# Patient Record
Sex: Male | Born: 1965 | Race: White | Hispanic: No | Marital: Married | Smoking: Never smoker
Health system: Southern US, Community
[De-identification: ages and names within clinical notes are randomized; demographics above are authoritative.]

## PROBLEM LIST (undated history)

## (undated) DIAGNOSIS — R519 Headache, unspecified: Secondary | ICD-10-CM

## (undated) DIAGNOSIS — R51 Headache: Secondary | ICD-10-CM

## (undated) DIAGNOSIS — I10 Essential (primary) hypertension: Secondary | ICD-10-CM

## (undated) DIAGNOSIS — K219 Gastro-esophageal reflux disease without esophagitis: Secondary | ICD-10-CM

## (undated) HISTORY — PX: EYE SURGERY: SHX253

## (undated) HISTORY — PX: KNEE CARTILAGE SURGERY: SHX688

---

## 1999-11-02 ENCOUNTER — Ambulatory Visit (HOSPITAL_COMMUNITY): Admission: RE | Admit: 1999-11-02 | Discharge: 1999-11-02 | Payer: Self-pay | Admitting: Gastroenterology

## 2000-02-17 ENCOUNTER — Ambulatory Visit (HOSPITAL_COMMUNITY): Admission: RE | Admit: 2000-02-17 | Discharge: 2000-02-17 | Payer: Self-pay | Admitting: *Deleted

## 2000-02-20 ENCOUNTER — Ambulatory Visit (HOSPITAL_COMMUNITY): Admission: RE | Admit: 2000-02-20 | Discharge: 2000-02-20 | Payer: Self-pay | Admitting: *Deleted

## 2000-02-20 ENCOUNTER — Encounter: Payer: Self-pay | Admitting: Internal Medicine

## 2002-06-17 ENCOUNTER — Ambulatory Visit (HOSPITAL_COMMUNITY): Admission: RE | Admit: 2002-06-17 | Discharge: 2002-06-17 | Payer: Self-pay | Admitting: Gastroenterology

## 2002-11-24 ENCOUNTER — Ambulatory Visit (HOSPITAL_COMMUNITY): Admission: RE | Admit: 2002-11-24 | Discharge: 2002-11-24 | Payer: Self-pay | Admitting: Internal Medicine

## 2002-11-26 ENCOUNTER — Encounter: Payer: Self-pay | Admitting: *Deleted

## 2002-11-26 ENCOUNTER — Ambulatory Visit (HOSPITAL_COMMUNITY): Admission: RE | Admit: 2002-11-26 | Discharge: 2002-11-26 | Payer: Self-pay | Admitting: *Deleted

## 2005-03-01 ENCOUNTER — Ambulatory Visit (HOSPITAL_COMMUNITY): Admission: RE | Admit: 2005-03-01 | Discharge: 2005-03-01 | Payer: Self-pay | Admitting: Gastroenterology

## 2005-11-29 ENCOUNTER — Emergency Department (HOSPITAL_COMMUNITY): Admission: EM | Admit: 2005-11-29 | Discharge: 2005-11-29 | Payer: Self-pay | Admitting: Emergency Medicine

## 2015-04-22 ENCOUNTER — Encounter (INDEPENDENT_AMBULATORY_CARE_PROVIDER_SITE_OTHER): Payer: Self-pay | Admitting: *Deleted

## 2015-05-26 MED FILL — BYSTOLIC 5 MG TABLET: 5 | 90 days supply | Qty: 90 | Fill #1

## 2015-05-26 MED FILL — NORTRIPTYLINE HCL 10 MG CAP: 10 | 90 days supply | Qty: 180 | Fill #3

## 2015-05-26 MED FILL — VALACYCLOVIR HCL 500 MG TAB: 500 | 30 days supply | Qty: 30 | Fill #5

## 2015-06-28 MED FILL — VALACYCLOVIR HCL 500 MG TAB: 500 | 30 days supply | Qty: 30 | Fill #6

## 2015-07-26 MED FILL — VALACYCLOVIR HCL 500 MG TAB: 500 | 30 days supply | Qty: 30 | Fill #7

## 2015-07-26 MED FILL — PANTOPRAZOLE SOD DR 40 MG T: 40 | 90 days supply | Qty: 90 | Fill #1

## 2015-08-17 DIAGNOSIS — H35033 Hypertensive retinopathy, bilateral: Secondary | ICD-10-CM | POA: Diagnosis not present

## 2015-08-17 DIAGNOSIS — B0051 Herpesviral iridocyclitis: Secondary | ICD-10-CM | POA: Diagnosis not present

## 2015-08-17 DIAGNOSIS — H21542 Posterior synechiae (iris), left eye: Secondary | ICD-10-CM | POA: Diagnosis not present

## 2015-08-17 MED FILL — VALACYCLOVIR HCL 500 MG TAB: 500 | 30 days supply | Qty: 30 | Fill #0

## 2015-08-17 MED FILL — NORTRIPTYLINE HCL 10 MG CAP: 10 | 90 days supply | Qty: 180 | Fill #0

## 2015-08-23 MED FILL — BYSTOLIC 5 MG TABLET: 5 | 90 days supply | Qty: 90 | Fill #2

## 2015-08-25 DIAGNOSIS — J029 Acute pharyngitis, unspecified: Secondary | ICD-10-CM | POA: Diagnosis not present

## 2015-08-25 DIAGNOSIS — L0292 Furuncle, unspecified: Secondary | ICD-10-CM | POA: Diagnosis not present

## 2015-09-27 MED FILL — VALACYCLOVIR HCL 500 MG TAB: 500 | 30 days supply | Qty: 30 | Fill #1

## 2015-10-27 MED FILL — PANTOPRAZOLE SOD DR 40 MG T: 40 | 90 days supply | Qty: 90 | Fill #2

## 2015-10-27 MED FILL — VALACYCLOVIR HCL 500 MG TAB: 500 | 30 days supply | Qty: 30 | Fill #2

## 2015-11-22 MED FILL — NORTRIPTYLINE HCL 10 MG CAP: 10 | 90 days supply | Qty: 180 | Fill #1

## 2015-11-30 MED FILL — BYSTOLIC 5 MG TABLET: 5 | 90 days supply | Qty: 90 | Fill #3

## 2015-11-30 MED FILL — VALACYCLOVIR HCL 500 MG TAB: 500 | 30 days supply | Qty: 30 | Fill #3

## 2015-12-24 MED FILL — VALACYCLOVIR HCL 500 MG TAB: 500 | 30 days supply | Qty: 30 | Fill #4

## 2016-01-20 MED FILL — VALACYCLOVIR HCL 500 MG TAB: 500 | 30 days supply | Qty: 30 | Fill #5

## 2016-01-20 MED FILL — PANTOPRAZOLE SOD DR 40 MG T: 40 | 90 days supply | Qty: 90 | Fill #3

## 2016-02-21 MED FILL — VALACYCLOVIR HCL 500 MG TAB: 500 | 30 days supply | Qty: 30 | Fill #6

## 2016-02-21 MED FILL — BYSTOLIC 5 MG TABLET: 5 | 90 days supply | Qty: 90 | Fill #4

## 2016-02-24 MED FILL — NORTRIPTYLINE HCL 10 MG CAP: 10 | 90 days supply | Qty: 180 | Fill #2

## 2016-03-22 MED FILL — VALACYCLOVIR HCL 500 MG TAB: 500 | 30 days supply | Qty: 30 | Fill #7

## 2016-04-19 MED FILL — PANTOPRAZOLE SOD DR 40 MG T: 40 | 90 days supply | Qty: 90 | Fill #4

## 2016-04-19 MED FILL — VALACYCLOVIR HCL 500 MG TAB: 500 | 30 days supply | Qty: 30 | Fill #8

## 2016-05-19 MED FILL — NORTRIPTYLINE HCL 10 MG CAP: 10 | 90 days supply | Qty: 180 | Fill #0

## 2016-05-19 MED FILL — VALACYCLOVIR HCL 500 MG TAB: 500 | 30 days supply | Qty: 30 | Fill #9

## 2016-05-26 MED FILL — BYSTOLIC 5 MG TABLET: 5 | 90 days supply | Qty: 90 | Fill #0

## 2016-06-19 MED FILL — VALACYCLOVIR HCL 500 MG TAB: 500 | 30 days supply | Qty: 30 | Fill #10

## 2016-07-17 MED FILL — VALACYCLOVIR HCL 500 MG TAB: 500 | 30 days supply | Qty: 30 | Fill #11

## 2016-07-19 MED FILL — PANTOPRAZOLE SOD DR 40 MG T: 40 | 90 days supply | Qty: 90 | Fill #0

## 2016-08-10 MED FILL — VALACYCLOVIR HCL 500 MG TAB: 500 | 30 days supply | Qty: 30 | Fill #0

## 2016-08-21 MED FILL — NORTRIPTYLINE HCL 10 MG CAP: 10 | 90 days supply | Qty: 180 | Fill #1

## 2016-08-21 MED FILL — BYSTOLIC 5 MG TABLET: 5 | 90 days supply | Qty: 90 | Fill #1

## 2016-09-08 DIAGNOSIS — Z0000 Encounter for general adult medical examination without abnormal findings: Secondary | ICD-10-CM | POA: Diagnosis not present

## 2016-09-08 DIAGNOSIS — Z1211 Encounter for screening for malignant neoplasm of colon: Secondary | ICD-10-CM | POA: Diagnosis not present

## 2016-09-08 DIAGNOSIS — Z789 Other specified health status: Secondary | ICD-10-CM | POA: Diagnosis not present

## 2016-09-08 DIAGNOSIS — I10 Essential (primary) hypertension: Secondary | ICD-10-CM | POA: Diagnosis not present

## 2016-09-08 DIAGNOSIS — Z1389 Encounter for screening for other disorder: Secondary | ICD-10-CM | POA: Diagnosis not present

## 2016-09-08 DIAGNOSIS — K219 Gastro-esophageal reflux disease without esophagitis: Secondary | ICD-10-CM | POA: Diagnosis not present

## 2016-09-08 DIAGNOSIS — Z125 Encounter for screening for malignant neoplasm of prostate: Secondary | ICD-10-CM | POA: Diagnosis not present

## 2016-09-08 DIAGNOSIS — Z6833 Body mass index (BMI) 33.0-33.9, adult: Secondary | ICD-10-CM | POA: Diagnosis not present

## 2016-09-08 DIAGNOSIS — Z79899 Other long term (current) drug therapy: Secondary | ICD-10-CM | POA: Diagnosis not present

## 2016-09-08 DIAGNOSIS — Z299 Encounter for prophylactic measures, unspecified: Secondary | ICD-10-CM | POA: Diagnosis not present

## 2016-09-08 DIAGNOSIS — R51 Headache: Secondary | ICD-10-CM | POA: Diagnosis not present

## 2016-09-08 DIAGNOSIS — R5383 Other fatigue: Secondary | ICD-10-CM | POA: Diagnosis not present

## 2016-09-08 MED FILL — SUMATRIPTAN SUCC 50 MG TAB: 50 | 30 days supply | Qty: 18 | Fill #0

## 2016-09-13 ENCOUNTER — Encounter (INDEPENDENT_AMBULATORY_CARE_PROVIDER_SITE_OTHER): Payer: Self-pay | Admitting: *Deleted

## 2016-09-18 MED FILL — VALACYCLOVIR HCL 500 MG TAB: 500 | 30 days supply | Qty: 30 | Fill #1

## 2016-09-19 DIAGNOSIS — B079 Viral wart, unspecified: Secondary | ICD-10-CM | POA: Diagnosis not present

## 2016-09-19 DIAGNOSIS — D485 Neoplasm of uncertain behavior of skin: Secondary | ICD-10-CM | POA: Diagnosis not present

## 2016-09-19 DIAGNOSIS — L821 Other seborrheic keratosis: Secondary | ICD-10-CM | POA: Diagnosis not present

## 2016-09-19 DIAGNOSIS — D225 Melanocytic nevi of trunk: Secondary | ICD-10-CM | POA: Diagnosis not present

## 2016-10-16 MED FILL — PANTOPRAZOLE SOD DR 40 MG T: 40 | 90 days supply | Qty: 90 | Fill #0

## 2016-10-16 MED FILL — VALACYCLOVIR HCL 500 MG TAB: 500 | 30 days supply | Qty: 30 | Fill #2

## 2016-11-13 MED FILL — VALACYCLOVIR HCL 500 MG TAB: 500 | 30 days supply | Qty: 30 | Fill #3

## 2016-11-20 MED FILL — NORTRIPTYLINE HCL 10 MG CAP: 10 | 90 days supply | Qty: 180 | Fill #2

## 2016-11-20 MED FILL — BYSTOLIC 5 MG TABLET: 5 | 90 days supply | Qty: 90 | Fill #2

## 2016-12-11 MED FILL — VALACYCLOVIR HCL 500 MG TAB: 500 | 30 days supply | Qty: 30 | Fill #4

## 2016-12-19 DIAGNOSIS — H35033 Hypertensive retinopathy, bilateral: Secondary | ICD-10-CM | POA: Diagnosis not present

## 2016-12-19 DIAGNOSIS — H21542 Posterior synechiae (iris), left eye: Secondary | ICD-10-CM | POA: Diagnosis not present

## 2016-12-19 DIAGNOSIS — B0051 Herpesviral iridocyclitis: Secondary | ICD-10-CM | POA: Diagnosis not present

## 2016-12-19 DIAGNOSIS — H3554 Dystrophies primarily involving the retinal pigment epithelium: Secondary | ICD-10-CM | POA: Diagnosis not present

## 2016-12-20 MED FILL — PREDNISOLONE AC 1% EYE DROP: 1 | 30 days supply | Qty: 5 | Fill #0

## 2017-01-05 MED FILL — PANTOPRAZOLE SOD DR 40 MG T: 40 | 90 days supply | Qty: 90 | Fill #1

## 2017-01-05 MED FILL — VALACYCLOVIR HCL 500 MG TAB: 500 | 30 days supply | Qty: 30 | Fill #5

## 2017-02-12 MED FILL — NORTRIPTYLINE HCL 10 MG CAP: 10 | 90 days supply | Qty: 180 | Fill #3

## 2017-02-12 MED FILL — VALACYCLOVIR HCL 500 MG TAB: 500 | 30 days supply | Qty: 30 | Fill #6

## 2017-02-12 MED FILL — BYSTOLIC 5 MG TABLET: 5 | 90 days supply | Qty: 90 | Fill #3

## 2017-03-13 MED FILL — VALACYCLOVIR HCL 500 MG TAB: 500 | 30 days supply | Qty: 30 | Fill #7

## 2017-04-10 MED FILL — PANTOPRAZOLE SOD DR 40 MG T: 40 | 90 days supply | Qty: 90 | Fill #2

## 2017-04-10 MED FILL — VALACYCLOVIR HCL 500 MG TAB: 500 | 30 days supply | Qty: 30 | Fill #8

## 2017-05-16 MED FILL — VALACYCLOVIR HCL 500 MG TAB: 500 | 30 days supply | Qty: 30 | Fill #9

## 2017-05-16 MED FILL — NORTRIPTYLINE HCL 10 MG CAP: 10 | 90 days supply | Qty: 180 | Fill #4

## 2017-05-28 MED FILL — BYSTOLIC 5 MG TABLET: 5 | 90 days supply | Qty: 90 | Fill #0

## 2017-06-12 MED FILL — VALACYCLOVIR HCL 500 MG TAB: 500 | 30 days supply | Qty: 30 | Fill #10

## 2017-07-11 MED FILL — VALACYCLOVIR HCL 500 MG TAB: 500 | 30 days supply | Qty: 30 | Fill #11

## 2017-07-13 MED FILL — PANTOPRAZOLE SOD DR 40 MG T: 40 | 90 days supply | Qty: 90 | Fill #3

## 2017-07-18 ENCOUNTER — Ambulatory Visit (INDEPENDENT_AMBULATORY_CARE_PROVIDER_SITE_OTHER): Payer: Self-pay | Admitting: Urgent Care

## 2017-07-18 ENCOUNTER — Encounter: Payer: Self-pay | Admitting: Urgent Care

## 2017-07-18 VITALS — BP 175/98 | HR 63 | Temp 98.1°F | Resp 18 | Ht 69.0 in | Wt 225.8 lb

## 2017-07-18 DIAGNOSIS — Z024 Encounter for examination for driving license: Secondary | ICD-10-CM

## 2017-07-18 DIAGNOSIS — B0050 Herpesviral ocular disease, unspecified: Secondary | ICD-10-CM

## 2017-07-18 DIAGNOSIS — I10 Essential (primary) hypertension: Secondary | ICD-10-CM

## 2017-07-18 NOTE — Patient Instructions (Addendum)
Please see your eye doctor for formal testing and bring back documentation of the results. You also need to check back with your PCP for your blood pressure as it is not qualifying at this time. We will not charge a visit today but I will be happy to see you back once these items are squared away.    Health Maintenance, Male A healthy lifestyle and preventive care is important for your health and wellness. Ask your health care provider about what schedule of regular examinations is right for you. What should I know about weight and diet? Eat a Healthy Diet  Eat plenty of vegetables, fruits, whole grains, low-fat dairy products, and lean protein.  Do not eat a lot of foods high in solid fats, added sugars, or salt.  Maintain a Healthy Weight Regular exercise can help you achieve or maintain a healthy weight. You should:  Do at least 150 minutes of exercise each week. The exercise should increase your heart rate and make you sweat (moderate-intensity exercise).  Do strength-training exercises at least twice a week.  Watch Your Levels of Cholesterol and Blood Lipids  Have your blood tested for lipids and cholesterol every 5 years starting at 53 years of age. If you are at high risk for heart disease, you should start having your blood tested when you are 53 years old. You may need to have your cholesterol levels checked more often if: ? Your lipid or cholesterol levels are high. ? You are older than 53 years of age. ? You are at high risk for heart disease.  What should I know about cancer screening? Many types of cancers can be detected early and may often be prevented. Lung Cancer  You should be screened every year for lung cancer if: ? You are a current smoker who has smoked for at least 30 years. ? You are a former smoker who has quit within the past 15 years.  Talk to your health care provider about your screening options, when you should start screening, and how often you should  be screened.  Colorectal Cancer  Routine colorectal cancer screening usually begins at 53 years of age and should be repeated every 5-10 years until you are 53 years old. You may need to be screened more often if early forms of precancerous polyps or small growths are found. Your health care provider may recommend screening at an earlier age if you have risk factors for colon cancer.  Your health care provider may recommend using home test kits to check for hidden blood in the stool.  A small camera at the end of a tube can be used to examine your colon (sigmoidoscopy or colonoscopy). This checks for the earliest forms of colorectal cancer.  Prostate and Testicular Cancer  Depending on your age and overall health, your health care provider may do certain tests to screen for prostate and testicular cancer.  Talk to your health care provider about any symptoms or concerns you have about testicular or prostate cancer.  Skin Cancer  Check your skin from head to toe regularly.  Tell your health care provider about any new moles or changes in moles, especially if: ? There is a change in a mole's size, shape, or color. ? You have a mole that is larger than a pencil eraser.  Always use sunscreen. Apply sunscreen liberally and repeat throughout the day.  Protect yourself by wearing long sleeves, pants, a wide-brimmed hat, and sunglasses when outside.  What should  I know about heart disease, diabetes, and high blood pressure?  If you are 69-69 years of age, have your blood pressure checked every 3-5 years. If you are 59 years of age or older, have your blood pressure checked every year. You should have your blood pressure measured twice-once when you are at a hospital or clinic, and once when you are not at a hospital or clinic. Record the average of the two measurements. To check your blood pressure when you are not at a hospital or clinic, you can use: ? An automated blood pressure machine at  a pharmacy. ? A home blood pressure monitor.  Talk to your health care provider about your target blood pressure.  If you are between 75-76 years old, ask your health care provider if you should take aspirin to prevent heart disease.  Have regular diabetes screenings by checking your fasting blood sugar level. ? If you are at a normal weight and have a low risk for diabetes, have this test once every three years after the age of 49. ? If you are overweight and have a high risk for diabetes, consider being tested at a younger age or more often.  A one-time screening for abdominal aortic aneurysm (AAA) by ultrasound is recommended for men aged 74-75 years who are current or former smokers. What should I know about preventing infection? Hepatitis B If you have a higher risk for hepatitis B, you should be screened for this virus. Talk with your health care provider to find out if you are at risk for hepatitis B infection. Hepatitis C Blood testing is recommended for:  Everyone born from [birth year] through 1966.  Anyone with known risk factors for hepatitis C.  Sexually Transmitted Diseases (STDs)  You should be screened each year for STDs including gonorrhea and chlamydia if: ? You are sexually active and are younger than 53 years of age. ? You are older than 53 years of age and your health care provider tells you that you are at risk for this type of infection. ? Your sexual activity has changed since you were last screened and you are at an increased risk for chlamydia or gonorrhea. Ask your health care provider if you are at risk.  Talk with your health care provider about whether you are at high risk of being infected with HIV. Your health care provider may recommend a prescription medicine to help prevent HIV infection.  What else can I do?  Schedule regular health, dental, and eye exams.  Stay current with your vaccines (immunizations).  Do not use any tobacco products, such as  cigarettes, chewing tobacco, and e-cigarettes. If you need help quitting, ask your health care provider.  Limit alcohol intake to no more than 2 drinks per day. One drink equals 12 ounces of beer, 5 ounces of wine, or 1 ounces of hard liquor.  Do not use street drugs.  Do not share needles.  Ask your health care provider for help if you need support or information about quitting drugs.  Tell your health care provider if you often feel depressed.  Tell your health care provider if you have ever been abused or do not feel safe at home. This information is not intended to replace advice given to you by your health care provider. Make sure you discuss any questions you have with your health care provider. Document Released: 10/28/2007 Document Revised: 12/29/2015 Document Reviewed: 02/02/2015 Elsevier Interactive Patient Education  [employer].  IF you received an x-ray today, you will receive an invoice from [facility]. Please contact [facility] at [phone] with questions or concerns regarding your invoice.   IF you received labwork today, you will receive an invoice from [facility]. Please contact [facility] at [phone] with questions or concerns regarding your invoice.   Our billing staff will not be able to assist you with questions regarding bills from these companies.  You will be contacted with the lab results as soon as they are available. The fastest way to get your results is to activate your My Chart account. Instructions are located on the last page of this paperwork. If you have not heard from us regarding the results in 2 weeks, please contact this office.      

## 2017-07-18 NOTE — Progress Notes (Signed)
  Commercial Driver Medical Examination   Xavier Walsh is a 52 y.o. male who presents today for a DOT physical exam. The patient reports that he has previously been disqualified for DOT due to his vision. He does not have an active DOT certificate. His HTN is managed by Dr. Woody Seller. Has not seen him for recheck.  The following portions of the patient's history were reviewed and updated as appropriate: allergies, current medications, past family history, past medical history, past social history and past surgical history.  Objective:   BP (!) 175/98   Pulse 63   Temp 98.1 F (36.7 C) (Oral)   Resp 18   Ht 5\' 9"  (1.753 m)   Wt 225 lb 12.8 oz (102.4 kg)   SpO2 98%   BMI 33.34 kg/m   Vision/hearing:  Visual Acuity Screening   Right eye Left eye Both eyes  Without correction:     With correction: 20/20 20/70 20/20   Comments: Peripheral Vision: Right eye 70 degrees. Left eye 70 degrees.  The patient can distinguish the colors red, amber and green.  Hearing Screening Comments: The patient was able to hear a forced whisper from 10 feet.  Patient can recognize and distinguish among traffic control signals and devices showing standard red, green, and amber colors.  Corrective lenses required: Yes  Monocular Vision?: No  Hearing aid requirement: No  Physical Exam Not examined.  Labs: Comments: Urine Specimen:   SpGr:  1.015   Blood: Negative   Glucose:  Negative   Protein:  Negative  Assessment:   Patient not examined due to uncontrolled HTN, failing his eye screen.    Plan:   Patient will see his ophthalmologist and bring back documentation. He will also see Dr. Woody Seller to recheck and start management of his HTN. No charge today as I did not review his DOT form fully or examine the patient due to disqualifying parameters. Follow up as needed.  Jaynee Eagles, PA-C [facility] [phone] 07/18/2017  9:29 AM

## 2017-08-07 MED FILL — VALACYCLOVIR HCL 500 MG TAB: 500 | 30 days supply | Qty: 30 | Fill #0

## 2017-08-07 MED FILL — NORTRIPTYLINE HCL 10 MG CAP: 10 | 90 days supply | Qty: 180 | Fill #0

## 2017-08-29 MED FILL — BYSTOLIC 5 MG TABLET: 5 | 90 days supply | Qty: 90 | Fill #1

## 2017-09-11 MED FILL — VALACYCLOVIR HCL 500 MG TAB: 500 | 30 days supply | Qty: 30 | Fill #1

## 2017-09-14 DIAGNOSIS — K219 Gastro-esophageal reflux disease without esophagitis: Secondary | ICD-10-CM | POA: Diagnosis not present

## 2017-09-14 DIAGNOSIS — Z299 Encounter for prophylactic measures, unspecified: Secondary | ICD-10-CM | POA: Diagnosis not present

## 2017-09-14 DIAGNOSIS — Z1331 Encounter for screening for depression: Secondary | ICD-10-CM | POA: Diagnosis not present

## 2017-09-14 DIAGNOSIS — Z79899 Other long term (current) drug therapy: Secondary | ICD-10-CM | POA: Diagnosis not present

## 2017-09-14 DIAGNOSIS — Z0000 Encounter for general adult medical examination without abnormal findings: Secondary | ICD-10-CM | POA: Diagnosis not present

## 2017-09-14 DIAGNOSIS — R51 Headache: Secondary | ICD-10-CM | POA: Diagnosis not present

## 2017-09-14 DIAGNOSIS — Z1211 Encounter for screening for malignant neoplasm of colon: Secondary | ICD-10-CM | POA: Diagnosis not present

## 2017-09-14 DIAGNOSIS — Z789 Other specified health status: Secondary | ICD-10-CM | POA: Diagnosis not present

## 2017-09-14 DIAGNOSIS — Z125 Encounter for screening for malignant neoplasm of prostate: Secondary | ICD-10-CM | POA: Diagnosis not present

## 2017-09-14 DIAGNOSIS — I10 Essential (primary) hypertension: Secondary | ICD-10-CM | POA: Diagnosis not present

## 2017-09-14 DIAGNOSIS — Z6832 Body mass index (BMI) 32.0-32.9, adult: Secondary | ICD-10-CM | POA: Diagnosis not present

## 2017-09-14 DIAGNOSIS — R5383 Other fatigue: Secondary | ICD-10-CM | POA: Diagnosis not present

## 2017-09-14 MED FILL — SUMATRIPTAN SUCC 50 MG TAB: 50 | 30 days supply | Qty: 15 | Fill #0

## 2017-10-09 MED FILL — VALACYCLOVIR HCL 500 MG TAB: 500 | 30 days supply | Qty: 30 | Fill #2

## 2017-10-09 MED FILL — PANTOPRAZOLE SOD DR 40 MG T: 40 | 90 days supply | Qty: 90 | Fill #0

## 2017-10-12 ENCOUNTER — Encounter (INDEPENDENT_AMBULATORY_CARE_PROVIDER_SITE_OTHER): Payer: Self-pay | Admitting: *Deleted

## 2017-11-01 ENCOUNTER — Encounter (INDEPENDENT_AMBULATORY_CARE_PROVIDER_SITE_OTHER): Payer: Self-pay | Admitting: *Deleted

## 2017-11-02 ENCOUNTER — Other Ambulatory Visit (INDEPENDENT_AMBULATORY_CARE_PROVIDER_SITE_OTHER): Payer: Self-pay | Admitting: *Deleted

## 2017-11-02 DIAGNOSIS — Z1211 Encounter for screening for malignant neoplasm of colon: Secondary | ICD-10-CM

## 2017-11-12 MED FILL — NORTRIPTYLINE HCL 10 MG CAP: 10 | 90 days supply | Qty: 180 | Fill #1

## 2017-11-12 MED FILL — VALACYCLOVIR HCL 500 MG TAB: 500 | 30 days supply | Qty: 30 | Fill #3

## 2017-11-19 MED FILL — BYSTOLIC 5 MG TABLET: 5 | 90 days supply | Qty: 90 | Fill #2

## 2017-12-11 MED FILL — VALACYCLOVIR HCL 500 MG TAB: 500 | 30 days supply | Qty: 30 | Fill #4

## 2017-12-19 ENCOUNTER — Telehealth (INDEPENDENT_AMBULATORY_CARE_PROVIDER_SITE_OTHER): Payer: Self-pay | Admitting: *Deleted

## 2017-12-19 ENCOUNTER — Encounter (INDEPENDENT_AMBULATORY_CARE_PROVIDER_SITE_OTHER): Payer: Self-pay | Admitting: *Deleted

## 2017-12-19 MED ORDER — SUPREP BOWEL PREP KIT 17.5-3.13-1.6 GM/177ML PO SOLN: 1.0000 | Freq: Once | ORAL | 0 refills | Status: AC

## 2017-12-19 MED FILL — SUPREP BOWEL PREP KIT: 17.5-3.13-1 | 1 days supply | Qty: 354 | Fill #0

## 2017-12-19 NOTE — Telephone Encounter (Signed)
Patient needs suprep 

## 2017-12-28 ENCOUNTER — Encounter (INDEPENDENT_AMBULATORY_CARE_PROVIDER_SITE_OTHER): Payer: Self-pay | Admitting: *Deleted

## 2017-12-31 ENCOUNTER — Telehealth (INDEPENDENT_AMBULATORY_CARE_PROVIDER_SITE_OTHER): Payer: Self-pay | Admitting: *Deleted

## 2017-12-31 NOTE — Telephone Encounter (Signed)
Referring MD/PCP: vyas   Procedure: tcs  Reason/Indication:  screening  Has patient had this procedure before?  no  If so, when, by whom and where?    Is there a family history of colon cancer?  no  Who?  What age when diagnosed?    Is patient diabetic?   no      Does patient have prosthetic heart valve or mechanical valve?  no  Do you have a pacemaker?  no  Has patient ever had endocarditis? no  Has patient had joint replacement within last 12 months?  no  Is patient constipated or do they take laxatives? no  Does patient have a history of alcohol/drug use?  no  Is patient on blood thinner such as Coumadin, Plavix and/or Aspirin? no  Medications: pantoprazole 40 mg daily, bystolic 5 mg daily, valcyclovir 500 mg daily, nortriptyline 20 mg daily, ibuprofen prn, sumatriptan 50 mg prn  Allergies: reglan, fluorescein  Medication Adjustment per Dr Lindi Adie, NP:   Procedure date & time: 01/28/18 at 730

## 2018-01-01 NOTE — Telephone Encounter (Signed)
agree

## 2018-01-09 ENCOUNTER — Other Ambulatory Visit (HOSPITAL_COMMUNITY): Payer: Self-pay | Admitting: Internal Medicine

## 2018-01-09 ENCOUNTER — Ambulatory Visit (HOSPITAL_COMMUNITY)
Admission: RE | Admit: 2018-01-09 | Discharge: 2018-01-09 | Disposition: A | Discharge status: Home / Self Care | Payer: 59 | Source: Ambulatory Visit | Attending: Internal Medicine | Admitting: Internal Medicine

## 2018-01-09 ENCOUNTER — Other Ambulatory Visit (INDEPENDENT_AMBULATORY_CARE_PROVIDER_SITE_OTHER): Payer: Self-pay | Admitting: *Deleted

## 2018-01-09 DIAGNOSIS — I10 Essential (primary) hypertension: Secondary | ICD-10-CM | POA: Diagnosis not present

## 2018-01-09 DIAGNOSIS — Z299 Encounter for prophylactic measures, unspecified: Secondary | ICD-10-CM | POA: Diagnosis not present

## 2018-01-09 DIAGNOSIS — Z1211 Encounter for screening for malignant neoplasm of colon: Secondary | ICD-10-CM

## 2018-01-09 DIAGNOSIS — R059 Cough, unspecified: Secondary | ICD-10-CM

## 2018-01-09 DIAGNOSIS — R509 Fever, unspecified: Secondary | ICD-10-CM | POA: Diagnosis not present

## 2018-01-09 DIAGNOSIS — Z713 Dietary counseling and surveillance: Secondary | ICD-10-CM | POA: Diagnosis not present

## 2018-01-09 DIAGNOSIS — R05 Cough: Secondary | ICD-10-CM | POA: Diagnosis not present

## 2018-01-09 DIAGNOSIS — K219 Gastro-esophageal reflux disease without esophagitis: Secondary | ICD-10-CM

## 2018-01-09 DIAGNOSIS — Z6832 Body mass index (BMI) 32.0-32.9, adult: Secondary | ICD-10-CM | POA: Diagnosis not present

## 2018-01-09 DIAGNOSIS — R12 Heartburn: Secondary | ICD-10-CM | POA: Insufficient documentation

## 2018-01-09 MED FILL — AMOXICILLIN 500 MG CAPSULE: 500 | 7 days supply | Qty: 21 | Fill #0

## 2018-01-11 MED FILL — PANTOPRAZOLE SOD DR 40 MG T: 40 | 90 days supply | Qty: 90 | Fill #1

## 2018-01-11 MED FILL — VALACYCLOVIR HCL 500 MG TAB: 500 | 30 days supply | Qty: 30 | Fill #0

## 2018-01-28 ENCOUNTER — Other Ambulatory Visit: Payer: Self-pay

## 2018-01-28 ENCOUNTER — Encounter (HOSPITAL_COMMUNITY): Payer: Self-pay

## 2018-01-28 ENCOUNTER — Ambulatory Visit (HOSPITAL_COMMUNITY): Admit: 2018-01-28 | Payer: 59 | Admitting: Internal Medicine

## 2018-01-28 ENCOUNTER — Encounter (HOSPITAL_COMMUNITY)
Admission: RE | Disposition: A | Discharge status: Home / Self Care | Payer: Self-pay | Source: Ambulatory Visit | Attending: Internal Medicine

## 2018-01-28 ENCOUNTER — Ambulatory Visit (HOSPITAL_COMMUNITY)
Admission: RE | Admit: 2018-01-28 | Discharge: 2018-01-28 | Disposition: A | Discharge status: Home / Self Care | Payer: 59 | Source: Ambulatory Visit | Attending: Internal Medicine | Admitting: Internal Medicine

## 2018-01-28 DIAGNOSIS — K222 Esophageal obstruction: Secondary | ICD-10-CM | POA: Diagnosis not present

## 2018-01-28 DIAGNOSIS — R12 Heartburn: Secondary | ICD-10-CM | POA: Insufficient documentation

## 2018-01-28 DIAGNOSIS — Z1211 Encounter for screening for malignant neoplasm of colon: Secondary | ICD-10-CM

## 2018-01-28 DIAGNOSIS — K5730 Diverticulosis of large intestine without perforation or abscess without bleeding: Secondary | ICD-10-CM | POA: Diagnosis not present

## 2018-01-28 DIAGNOSIS — I10 Essential (primary) hypertension: Secondary | ICD-10-CM | POA: Insufficient documentation

## 2018-01-28 DIAGNOSIS — K648 Other hemorrhoids: Secondary | ICD-10-CM | POA: Diagnosis not present

## 2018-01-28 DIAGNOSIS — D127 Benign neoplasm of rectosigmoid junction: Secondary | ICD-10-CM | POA: Diagnosis not present

## 2018-01-28 DIAGNOSIS — R1314 Dysphagia, pharyngoesophageal phase: Secondary | ICD-10-CM | POA: Insufficient documentation

## 2018-01-28 DIAGNOSIS — K621 Rectal polyp: Secondary | ICD-10-CM | POA: Diagnosis not present

## 2018-01-28 DIAGNOSIS — K219 Gastro-esophageal reflux disease without esophagitis: Secondary | ICD-10-CM | POA: Insufficient documentation

## 2018-01-28 DIAGNOSIS — D125 Benign neoplasm of sigmoid colon: Secondary | ICD-10-CM | POA: Insufficient documentation

## 2018-01-28 DIAGNOSIS — Z79899 Other long term (current) drug therapy: Secondary | ICD-10-CM | POA: Diagnosis not present

## 2018-01-28 DIAGNOSIS — K317 Polyp of stomach and duodenum: Secondary | ICD-10-CM | POA: Insufficient documentation

## 2018-01-28 DIAGNOSIS — R1310 Dysphagia, unspecified: Secondary | ICD-10-CM | POA: Diagnosis not present

## 2018-01-28 HISTORY — DX: Headache, unspecified: R51.9

## 2018-01-28 HISTORY — DX: Headache: R51

## 2018-01-28 HISTORY — PX: BIOPSY: SHX5522

## 2018-01-28 HISTORY — PX: POLYPECTOMY: SHX5525

## 2018-01-28 HISTORY — PX: COLONOSCOPY: SHX5424

## 2018-01-28 HISTORY — DX: Essential (primary) hypertension: I10

## 2018-01-28 HISTORY — DX: Gastro-esophageal reflux disease without esophagitis: K21.9

## 2018-01-28 HISTORY — PX: ESOPHAGOGASTRODUODENOSCOPY: SHX5428

## 2018-01-28 SURGERY — COLONOSCOPY
Anesthesia: Moderate Sedation

## 2018-01-28 MED ORDER — LIDOCAINE VISCOUS HCL 2 % MT SOLN
OROMUCOSAL | Status: DC | PRN
  Administered 2018-01-28 (×2): 4 mL via OROMUCOSAL

## 2018-01-28 MED ORDER — SODIUM CHLORIDE 0.9% FLUSH
INTRAVENOUS | Status: AC
  Filled 2018-01-28: qty 10

## 2018-01-28 MED ORDER — SODIUM CHLORIDE 0.9 % IV SOLN
INTRAVENOUS | Status: DC
  Administered 2018-01-28: 07:00:00 via INTRAVENOUS

## 2018-01-28 MED ORDER — STERILE WATER FOR IRRIGATION IR SOLN
Status: DC | PRN
  Administered 2018-01-28: 08:00:00

## 2018-01-28 MED ORDER — LIDOCAINE VISCOUS HCL 2 % MT SOLN
OROMUCOSAL | Status: AC
  Filled 2018-01-28: qty 15

## 2018-01-28 MED ORDER — MEPERIDINE HCL 50 MG/ML IJ SOLN
INTRAMUSCULAR | Status: DC | PRN
  Administered 2018-01-28 (×3): 25 mg via INTRAVENOUS

## 2018-01-28 MED ORDER — PROMETHAZINE HCL 25 MG/ML IJ SOLN
INTRAMUSCULAR | Status: AC
  Filled 2018-01-28: qty 1

## 2018-01-28 MED ORDER — MIDAZOLAM HCL 5 MG/5ML IJ SOLN
INTRAMUSCULAR | Status: DC | PRN
  Administered 2018-01-28 (×5): 2 mg via INTRAVENOUS

## 2018-01-28 MED ORDER — MEPERIDINE HCL 50 MG/ML IJ SOLN
INTRAMUSCULAR | Status: AC
  Filled 2018-01-28: qty 1

## 2018-01-28 MED ORDER — MIDAZOLAM HCL 5 MG/5ML IJ SOLN
INTRAMUSCULAR | Status: AC
  Filled 2018-01-28: qty 10

## 2018-01-28 MED ORDER — PROMETHAZINE HCL 25 MG/ML IJ SOLN
INTRAMUSCULAR | Status: DC | PRN
  Administered 2018-01-28: 6.25 mg via INTRAVENOUS

## 2018-01-28 NOTE — Discharge Instructions (Signed)
Resume usual medications as before.  Remember to take pantoprazole 30 minutes before breakfast daily. Resume usual diet. No driving for 24 hours. Physician will call with biopsy results.   Colonoscopy, Adult, Care After This sheet gives you information about how to care for yourself after your procedure. Your health care provider may also give you more specific instructions. If you have problems or questions, contact your health care provider. What can I expect after the procedure? After the procedure, it is common to have:  A small amount of blood in your stool for 24 hours after the procedure.  Some gas.  Mild abdominal cramping or bloating.  Follow these instructions at home: General instructions   For the first 24 hours after the procedure: ? Do not drive or use machinery. ? Do not sign important documents. ? Do not drink alcohol. ? Do your regular daily activities at a slower pace than normal. ? Eat soft, easy-to-digest foods. ? Rest often.  Take over-the-counter or prescription medicines only as told by your health care provider.  It is up to you to get the results of your procedure. Ask your health care provider, or the department performing the procedure, when your results will be ready. Relieving cramping and bloating  Try walking around when you have cramps or feel bloated.  Apply heat to your abdomen as told by your health care provider. Use a heat source that your health care provider recommends, such as a moist heat pack or a heating pad. ? Place a towel between your skin and the heat source. ? Leave the heat on for 20-30 minutes. ? Remove the heat if your skin turns bright red. This is especially important if you are unable to feel pain, heat, or cold. You may have a greater risk of getting burned. Eating and drinking  Drink enough fluid to keep your urine clear or pale yellow.  Resume your normal diet as instructed by your health care provider. Avoid heavy  or fried foods that are hard to digest.  Avoid drinking alcohol for as long as instructed by your health care provider. Contact a health care provider if:  You have blood in your stool 2-3 days after the procedure. Get help right away if:  You have more than a small spotting of blood in your stool.  You pass large blood clots in your stool.  Your abdomen is swollen.  You have nausea or vomiting.  You have a fever.  You have increasing abdominal pain that is not relieved with medicine. This information is not intended to replace advice given to you by your health care provider. Make sure you discuss any questions you have with your health care provider. Document Released: 12/14/2003 Document Revised: 01/24/2016 Document Reviewed: 07/13/2015 Elsevier Interactive Patient Education  2018 [employer].  Esophagogastroduodenoscopy, Care After Refer to this sheet in the next few weeks. These instructions provide you with information about caring for yourself after your procedure. Your health care provider may also give you more specific instructions. Your treatment has been planned according to current medical practices, but problems sometimes occur. Call your health care provider if you have any problems or questions after your procedure. What can I expect after the procedure? After the procedure, it is common to have:  A sore throat.  Nausea.  Bloating.  Dizziness.  Fatigue.  Follow these instructions at home:  Do not eat or drink anything until the numbing medicine (local anesthetic) has worn off and your gag reflex  has returned. You will know that the local anesthetic has worn off when you can swallow comfortably.  Do not drive for 24 hours if you received a medicine to help you relax (sedative).  If your health care provider took a tissue sample for testing during the procedure, make sure to get your test results. This is your responsibility. Ask your health care provider  or the department performing the test when your results will be ready.  Keep all follow-up visits as told by your health care provider. This is important. Contact a health care provider if:  You cannot stop coughing.  You are not urinating.  You are urinating less than usual. Get help right away if:  You have trouble swallowing.  You cannot eat or drink.  You have throat or chest pain that gets worse.  You are dizzy or light-headed.  You faint.  You have nausea or vomiting.  You have chills.  You have a fever.  You have severe abdominal pain.  You have black, tarry, or bloody stools. This information is not intended to replace advice given to you by your health care provider. Make sure you discuss any questions you have with your health care provider. Document Released: 04/17/2012 Document Revised: 10/07/2015 Document Reviewed: 03/25/2015 Elsevier Interactive Patient Education  2018 [place].   Moderate Conscious Sedation, Adult, Care After These instructions provide you with information about caring for yourself after your procedure. Your health care provider may also give you more specific instructions. Your treatment has been planned according to current medical practices, but problems sometimes occur. Call your health care provider if you have any problems or questions after your procedure. What can I expect after the procedure? After your procedure, it is common:  To feel sleepy for several hours.  To feel clumsy and have poor balance for several hours.  To have poor judgment for several hours.  To vomit if you eat too soon.  Follow these instructions at home: For at least 24 hours after the procedure:   Do not: ? Participate in activities where you could fall or become injured. ? Drive. ? Use heavy machinery. ? Drink alcohol. ? Take sleeping pills or medicines that cause drowsiness. ? Make important decisions or sign legal documents. ? Take care  of children on your own.  Rest. Eating and drinking  Follow the diet recommended by your health care provider.  If you vomit: ? Drink water, juice, or soup when you can drink without vomiting. ? Make sure you have little or no nausea before eating solid foods. General instructions  Have a responsible adult stay with you until you are awake and alert.  Take over-the-counter and prescription medicines only as told by your health care provider.  If you smoke, do not smoke without supervision.  Keep all follow-up visits as told by your health care provider. This is important. Contact a health care provider if:  You keep feeling nauseous or you keep vomiting.  You feel light-headed.  You develop a rash.  You have a fever. Get help right away if:  You have trouble breathing. This information is not intended to replace advice given to you by your health care provider. Make sure you discuss any questions you have with your health care provider. Document Released: 02/19/2013 Document Revised: 10/04/2015 Document Reviewed: 08/21/2015 Elsevier Interactive Patient Education  2018 [place].   Gastroesophageal Reflux Disease, Adult Normally, food travels down the esophagus and stays in the stomach to be  digested. However, when a person has gastroesophageal reflux disease (GERD), food and stomach acid move back up into the esophagus. When this happens, the esophagus becomes sore and inflamed. Over time, GERD can create small holes (ulcers) in the lining of the esophagus. What are the causes? This condition is caused by a problem with the muscle between the esophagus and the stomach (lower esophageal sphincter, or LES). Normally, the LES muscle closes after food passes through the esophagus to the stomach. When the LES is weakened or abnormal, it does not close properly, and that allows food and stomach acid to go back up into the esophagus. The LES can be weakened by certain dietary  substances, medicines, and medical conditions, including:  Tobacco use.  Pregnancy.  Having a hiatal hernia.  Heavy alcohol use.  Certain foods and beverages, such as coffee, chocolate, onions, and peppermint.  What increases the risk? This condition is more likely to develop in:  People who have an increased body weight.  People who have connective tissue disorders.  People who use NSAID medicines.  What are the signs or symptoms? Symptoms of this condition include:  Heartburn.  Difficult or painful swallowing.  The feeling of having a lump in the throat.  Abitter taste in the mouth.  Bad breath.  Having a large amount of saliva.  Having an upset or bloated stomach.  Belching.  Chest pain.  Shortness of breath or wheezing.  Ongoing (chronic) cough or a night-time cough.  Wearing away of tooth enamel.  Weight loss.  Different conditions can cause chest pain. Make sure to see your health care provider if you experience chest pain. How is this diagnosed? Your health care provider will take a medical history and perform a physical exam. To determine if you have mild or severe GERD, your health care provider may also monitor how you respond to treatment. You may also have other tests, including:  An endoscopy toexamine your stomach and esophagus with a small camera.  A test thatmeasures the acidity level in your esophagus.  A test thatmeasures how much pressure is on your esophagus.  A barium swallow or modified barium swallow to show the shape, size, and functioning of your esophagus.  How is this treated? The goal of treatment is to help relieve your symptoms and to prevent complications. Treatment for this condition may vary depending on how severe your symptoms are. Your health care provider may recommend:  Changes to your diet.  Medicine.  Surgery.  Follow these instructions at home: Diet  Follow a diet as recommended by your health care  provider. This may involve avoiding foods and drinks such as: ? Coffee and tea (with or without caffeine). ? Drinks that containalcohol. ? Energy drinks and sports drinks. ? Carbonated drinks or sodas. ? Chocolate and cocoa. ? Peppermint and mint flavorings. ? Garlic and onions. ? Horseradish. ? Spicy and acidic foods, including peppers, chili powder, curry powder, vinegar, hot sauces, and barbecue sauce. ? Citrus fruit juices and citrus fruits, such as oranges, lemons, and limes. ? Tomato-based foods, such as red sauce, chili, salsa, and pizza with red sauce. ? Fried and fatty foods, such as donuts, french fries, potato chips, and high-fat dressings. ? High-fat meats, such as hot dogs and fatty cuts of red and white meats, such as rib eye steak, sausage, ham, and bacon. ? High-fat dairy items, such as whole milk, butter, and cream cheese.  Eat small, frequent meals instead of large meals.  Avoid drinking  large amounts of liquid with your meals.  Avoid eating meals during the 2-3 hours before bedtime.  Avoid lying down right after you eat.  Do not exercise right after you eat. General instructions  Pay attention to any changes in your symptoms.  Take over-the-counter and prescription medicines only as told by your health care provider. Do not take aspirin, ibuprofen, or other NSAIDs unless your health care provider told you to do so.  Do not use any tobacco products, including cigarettes, chewing tobacco, and e-cigarettes. If you need help quitting, ask your health care provider.  Wear loose-fitting clothing. Do not wear anything tight around your waist that causes pressure on your abdomen.  Raise (elevate) the head of your bed 6 inches (15cm).  Try to reduce your stress, such as with yoga or meditation. If you need help reducing stress, ask your health care provider.  If you are overweight, reduce your weight to an amount that is healthy for you. Ask your health care provider  for guidance about a safe weight loss goal.  Keep all follow-up visits as told by your health care provider. This is important. Contact a health care provider if:  You have new symptoms.  You have unexplained weight loss.  You have difficulty swallowing, or it hurts to swallow.  You have wheezing or a persistent cough.  Your symptoms do not improve with treatment.  You have a hoarse voice. Get help right away if:  You have pain in your arms, neck, jaw, teeth, or back.  You feel sweaty, dizzy, or light-headed.  You have chest pain or shortness of breath.  You vomit and your vomit looks like blood or coffee grounds.  You faint.  Your stool is bloody or black.  You cannot swallow, drink, or eat. This information is not intended to replace advice given to you by your health care provider. Make sure you discuss any questions you have with your health care provider. Document Released: 02/08/2005 Document Revised: 09/29/2015 Document Reviewed: 08/26/2014 Elsevier Interactive Patient Education  2018 [employer].   Gastric Polyps A gastric polyp, also called a stomach polyp, is a growth on the lining of the stomach. Most polyps are not dangerous, but some can be harmful because of their size, location, or type. Polyps that can become harmful include:  Large polyps. These can turn into sores (ulcers). Ulcers can lead to stomach bleeding.  Polyps that block food from moving from the stomach to the small intestine (gastric outlet obstruction).  A type of polyp called an adenoma. This type of polyp can become cancerous.  What are the causes? Gastric polyps form when the lining of the stomach gets inflamed or damaged. Stomach inflammation and damage may be caused by:  A long-lasting stomach condition, such as gastritis.  Certain medicines used to reduce stomach acid.  An inherited condition called familial adenomatous polyposis.  What are the signs or symptoms? Usually,  this condition does not cause any symptoms. If you do have symptoms, they may include:  Pain or tenderness in the abdomen.  Nausea.  Trouble eating or swallowing.  Blood in the stool.  Anemia.  How is this diagnosed? Gastric polyps are diagnosed with:  A medical procedure called endoscopy.  A lab test in which a part of the polyp is examined. This test is done with a sample of polyp tissue (biopsy) taken during an endoscopy.  How is this treated? Treatment depends on the type, location, and size of the polyps. Treatment  may involve:  Having the polyps checked regularly with an endoscopy.  Having the polyps removed with an endoscopy. This may be done if the polyps are harmful or can become harmful. Removing a polyp often prevents problems from developing.  Having the polyps removed with a surgery called a partial gastrectomy. This may be done in rare cases to remove very large polyps.  Treating the underlying condition that caused the polyps.  Follow these instructions at home:  Take over-the-counter and prescription medicines only as told by your health care provider.  Keep all follow-up visits as told by your health care provider. This is important. Contact a health care provider if:  You develop new symptoms.  Your symptoms get worse. Get help right away if:  You vomit blood.  You have severe abdominal pain.  You cannot eat or drink.  You have blood in your stool. This information is not intended to replace advice given to you by your health care provider. Make sure you discuss any questions you have with your health care provider. Document Released: 04/17/2012 Document Revised: 09/20/2015 Document Reviewed: 05/16/2015 Elsevier Interactive Patient Education  2018 [employer].   Colon Polyps Polyps are tissue growths inside the body. Polyps can grow in many places, including the large intestine (colon). A polyp may be a round bump or a mushroom-shaped growth.  You could have one polyp or several. Most colon polyps are noncancerous (benign). However, some colon polyps can become cancerous over time. What are the causes? The exact cause of colon polyps is not known. What increases the risk? This condition is more likely to develop in people who:  Have a family history of colon cancer or colon polyps.  Are older than 65 or older than 45 if they are African American.  Have inflammatory bowel disease, such as ulcerative colitis or Crohn disease.  Are overweight.  Smoke cigarettes.  Do not get enough exercise.  Drink too much alcohol.  Eat a diet that is: ? High in fat and red meat. ? Low in fiber.  Had childhood cancer that was treated with abdominal radiation.  What are the signs or symptoms? Most polyps do not cause symptoms. If you have symptoms, they may include:  Blood coming from your rectum when having a bowel movement.  Blood in your stool.The stool may look dark red or black.  A change in bowel habits, such as constipation or diarrhea.  How is this diagnosed? This condition is diagnosed with a colonoscopy. This is a procedure that uses a lighted, flexible scope to look at the inside of your colon. How is this treated? Treatment for this condition involves removing any polyps that are found. Those polyps will then be tested for cancer. If cancer is found, your health care provider will talk to you about options for colon cancer treatment. Follow these instructions at home: Diet  Eat plenty of fiber, such as fruits, vegetables, and whole grains.  Eat foods that are high in calcium and vitamin D, such as milk, cheese, yogurt, eggs, liver, fish, and broccoli.  Limit foods high in fat, red meats, and processed meats, such as hot dogs, sausage, bacon, and lunch meats.  Maintain a healthy weight, or lose weight if recommended by your health care provider. General instructions  Do not smoke cigarettes.  Do not drink  alcohol excessively.  Keep all follow-up visits as told by your health care provider. This is important. This includes keeping regularly scheduled colonoscopies. Talk to your health  care provider about when you need a colonoscopy.  Exercise every day or as told by your health care provider. Contact a health care provider if:  You have new or worsening bleeding during a bowel movement.  You have new or increased blood in your stool.  You have a change in bowel habits.  You unexpectedly lose weight. This information is not intended to replace advice given to you by your health care provider. Make sure you discuss any questions you have with your health care provider. Document Released: 01/26/2004 Document Revised: 10/07/2015 Document Reviewed: 03/22/2015 Elsevier Interactive Patient Education  2018 [employer].   Esophageal Dilatation Esophageal dilatation is a procedure to open a blocked or narrowed part of the esophagus. The esophagus is the long tube in your throat that carries food and liquid from your mouth to your stomach. The procedure is also called esophageal dilation. You may need this procedure if you have a buildup of scar tissue in your esophagus that makes it difficult, painful, or even impossible to swallow. This can be caused by gastroesophageal reflux disease (GERD). In rare cases, people need this procedure because they have cancer of the esophagus or a problem with the way food moves through the esophagus. Sometimes you may need to have another dilatation to enlarge the opening of the esophagus gradually. Tell a health care provider about:  Any allergies you have.  All medicines you are taking, including vitamins, herbs, eye drops, creams, and over-the-counter medicines.  Any problems you or family members have had with anesthetic medicines.  Any blood disorders you have.  Any surgeries you have had.  Any medical conditions you have.  Any antibiotic medicines  you are required to take before dental procedures. What are the risks? Generally, this is a safe procedure. However, problems can occur and include:  Bleeding from a tear in the lining of the esophagus.  A hole (perforation) in the esophagus.  What happens before the procedure?  Do not eat or drink anything after midnight on the night before the procedure or as directed by your health care provider.  Ask your health care provider about changing or stopping your regular medicines. This is especially important if you are taking diabetes medicines or blood thinners.  Plan to have someone take you home after the procedure. What happens during the procedure?  You will be given a medicine that makes you relaxed and sleepy (sedative).  A medicine may be sprayed or gargled to numb the back of the throat.  Your health care provider can use various instruments to do an esophageal dilatation. During the procedure, the instrument used will be placed in your mouth and passed down into your esophagus. Options include: ? Simple dilators. This instrument is carefully placed in the esophagus to stretch it. ? Guided wire bougies. In this method, a flexible tube (endoscope) is used to insert a wire into the esophagus. The dilator is passed over this wire to enlarge the esophagus. Then the wire is removed. ? Balloon dilators. An endoscope with a small balloon at the end is passed down into the esophagus. Inflating the balloon gently stretches the esophagus and opens it up. What happens after the procedure?  Your blood pressure, heart rate, breathing rate, and blood oxygen level will be monitored often until the medicines you were given have worn off.  Your throat may feel slightly sore and will probably still feel numb. This will improve slowly over time.  You will not be allowed to  eat or drink until the throat numbness has resolved.  If this is a same-day procedure, you may be allowed to go home once  you have been able to drink, urinate, and sit on the edge of the bed without nausea or dizziness.  If this is a same-day procedure, you should have a friend or family member with you for the next 24 hours after the procedure. This information is not intended to replace advice given to you by your health care provider. Make sure you discuss any questions you have with your health care provider. Document Released: 06/22/2005 Document Revised: 10/07/2015 Document Reviewed: 09/10/2013 Elsevier Interactive Patient Education  [employer].

## 2018-01-28 NOTE — Op Note (Signed)
[facility] Patient Name: Xavier Walsh Procedure Date: 01/28/2018 7:02 AM MRN: [id number] Date of Birth: 07-25-1965 Attending MD: Hildred Laser , MD CSN: [id number] Age: 52 Admit Type: Outpatient Procedure:                Colonoscopy Indications:              Screening for colorectal malignant neoplasm Providers:                Hildred Laser, MD, Jeanann Lewandowsky. Sharon Seller, RN, Aram Candela Referring MD:             Glenda Chroman, MD Medicines:                None Complications:            No immediate complications. Estimated Blood Loss:     Estimated blood loss was minimal. Procedure:                After obtaining informed consent, the colonoscope                            was passed under direct vision. Throughout the                            procedure, the patient's blood pressure, pulse, and                            oxygen saturations were monitored continuously. The                            CF-HQ190L ([id number]) scope was introduced through                            the anus and advanced to the the cecum, identified                            by appendiceal orifice and ileocecal valve. The                            colonoscopy was performed without difficulty. The                            patient tolerated the procedure well. The quality                            of the bowel preparation was excellent. The                            ileocecal valve, appendiceal orifice, and rectum                            were photographed. Scope In: 8:04:08 AM Scope Out: 8:20:40 AM Scope Withdrawal Time: 0 hours 12 minutes 10 seconds  Total Procedure Duration: 0 hours 16 minutes 32 seconds  Findings:  The perianal and digital rectal examinations were normal.      A few small-mouthed diverticula were found in the sigmoid colon.      Two sessile polyps were found in the rectum and sigmoid colon. The       polyps were small in size. These polyps were  removed with a cold snare.       Resection and retrieval were complete. The pathology specimen was placed       into Bottle Number 2.      Internal hemorrhoids were found during retroflexion. The hemorrhoids       were small. Impression:               - Diverticulosis in the sigmoid colon.                           - Two small polyps in the rectum and in the sigmoid                            colon, removed with a cold snare. Resected and                            retrieved.                           - Internal hemorrhoids. Moderate Sedation:      Moderate (conscious) sedation was administered by the endoscopy nurse       and supervised by the endoscopist. The following parameters were       monitored: oxygen saturation, heart rate, blood pressure, CO2       capnography and response to care. Total physician intraservice time was       21 minutes. Recommendation:           - Patient has a contact number available for                            emergencies. The signs and symptoms of potential                            delayed complications were discussed with the                            patient. Return to normal activities tomorrow.                            Written discharge instructions were provided to the                            patient.                           - High fiber diet today.                           - Continue present medications.                           - No aspirin, ibuprofen, naproxen, or  other                            non-steroidal anti-inflammatory drugs for 1 day.                           - Await pathology results.                           - Repeat colonoscopy is recommended. The                            colonoscopy date will be determined after pathology                            results from today's exam become available for                            review. Procedure Code(s):        --- Professional ---                           [phone],  Colonoscopy, flexible; with removal of                            tumor(s), polyp(s), or other lesion(s) by snare                            technique                           G0500, Moderate sedation services provided by the                            same physician or other qualified health care                            professional performing a gastrointestinal                            endoscopic service that sedation supports,                            requiring the presence of an independent trained                            observer to assist in the monitoring of the                            patient's level of consciousness and physiological                            status; initial 15 minutes of intra-service time;                            patient age 24 years or older (additional time  may                            be reported with [phone], as appropriate) Diagnosis Code(s):        --- Professional ---                           Z12.11, Encounter for screening for malignant                            neoplasm of colon                           K64.8, Other hemorrhoids                           K62.1, Rectal polyp                           D12.5, Benign neoplasm of sigmoid colon                           K57.30, Diverticulosis of large intestine without                            perforation or abscess without bleeding CPT copyright 2017 American Medical Association. All rights reserved. The codes documented in this report are preliminary and upon coder review may  be revised to meet current compliance requirements. Hildred Laser, MD Hildred Laser, MD 01/28/2018 8:37:42 AM This report has been signed electronically. Number of Addenda: 0

## 2018-01-28 NOTE — Op Note (Signed)
[facility] Patient Name: Xavier Walsh Procedure Date: 01/28/2018 7:12 AM MRN: [id number] Date of Birth: July 31, 1965 Attending MD: Hildred Laser , MD CSN: [id number] Age: 52 Admit Type: Outpatient Procedure:                Upper GI endoscopy Indications:              Esophageal dysphagia, Follow-up of                            gastro-esophageal reflux disease Providers:                Hildred Laser, MD, Otis Peak B. Gwenlyn Perking RN, RN, Aram Candela Referring MD:             Glenda Chroman Medicines:                Lidocaine spray, Promethazine 6.25 mg IV,                            Meperidine 75 mg IV, Midazolam 10 mg IV Complications:            No immediate complications. Estimated Blood Loss:     Estimated blood loss was minimal. Procedure:                Pre-Anesthesia Assessment:                           - Prior to the procedure, a History and Physical                            was performed, and patient medications and                            allergies were reviewed. The patient's tolerance of                            previous anesthesia was also reviewed. The risks                            and benefits of the procedure and the sedation                            options and risks were discussed with the patient.                            All questions were answered, and informed consent                            was obtained. Prior Anticoagulants: The patient has                            taken no previous anticoagulant or antiplatelet  agents. ASA Grade Assessment: II - A patient with                            mild systemic disease. After reviewing the risks                            and benefits, the patient was deemed in                            satisfactory condition to undergo the procedure.                           After obtaining informed consent, the endoscope was                            passed under  direct vision. Throughout the                            procedure, the patient's blood pressure, pulse, and                            oxygen saturations were monitored continuously. The                            [id number]-H190 ([id number]) scope was introduced through the                            mouth, and advanced to the second part of duodenum.                            The upper GI endoscopy was accomplished without                            difficulty. The patient tolerated the procedure                            well. Scope In: 7:47:51 AM Scope Out: 7:59:24 AM Total Procedure Duration: 0 hours 11 minutes 33 seconds  Findings:      The examined esophagus was normal.      One benign-appearing, intrinsic mild stenosis was found 41 cm from the       incisors. The stenosis was traversed. The scope was withdrawn. Dilation       was performed with a Maloney dilator with mild resistance at 56 Fr. The       dilation site was examined following endoscope reinsertion and showed       mild mucosal disruption and no perforation.      Multiple 3 to 8 mm pedunculated and sessile polyps with stigmata of       recent bleeding were found in the gastric body. Biopsies were taken with       a cold forceps for histology. The pathology specimen was placed into       Bottle Number 1.      The exam of the stomach was otherwise normal.      The duodenal bulb and second  portion of the duodenum were normal. Impression:               - Normal esophagus.                           - Benign-appearing esophageal stenosis. Dilated.                           - Multiple gastric polyps. Biopsied.                           - Normal duodenal bulb and second portion of the                            duodenum. Moderate Sedation:      Moderate (conscious) sedation was administered by the endoscopy nurse       and supervised by the endoscopist. The following parameters were       monitored: oxygen saturation, heart rate,  blood pressure, CO2       capnography and response to care. Total physician intraservice time was       21 minutes. Recommendation:           - Patient has a contact number available for                            emergencies. The signs and symptoms of potential                            delayed complications were discussed with the                            patient. Return to normal activities tomorrow.                            Written discharge instructions were provided to the                            patient.                           - Resume previous diet today.                           - Continue present medications.                           - No aspirin, ibuprofen, naproxen, or other                            non-steroidal anti-inflammatory drugs for 1 day.                           - Await pathology results. Procedure Code(s):        --- Professional ---                           [phone], Esophagogastroduodenoscopy, flexible,  transoral; with biopsy, single or multiple                           43450, Dilation of esophagus, by unguided sound or                            bougie, single or multiple passes                           G0500, Moderate sedation services provided by the                            same physician or other qualified health care                            professional performing a gastrointestinal                            endoscopic service that sedation supports,                            requiring the presence of an independent trained                            observer to assist in the monitoring of the                            patient's level of consciousness and physiological                            status; initial 15 minutes of intra-service time;                            patient age 61 years or older (additional time may                            be reported with [phone], as appropriate) Diagnosis Code(s):         --- Professional ---                           K22.2, Esophageal obstruction                           K31.7, Polyp of stomach and duodenum                           R13.14, Dysphagia, pharyngoesophageal phase                           K21.9, Gastro-esophageal reflux disease without                            esophagitis CPT copyright 2017 American Medical Association. All rights reserved. The codes documented in this report are preliminary and upon coder review may  be revised to meet current compliance requirements.  Hildred Laser, MD Hildred Laser, MD 01/28/2018 8:32:26 AM This report has been signed electronically. Number of Addenda: 0

## 2018-01-28 NOTE — H&P (Signed)
Xavier Walsh is an 52 y.o. male.   Chief Complaint: Patient is here for EGD, ED and colonoscopy. HPI: Patient is a 52 year old Caucasian male who has over 20-year history of GERD who presents with history of intermittent solid food dysphagia for the last few months.  He has difficulty with chicken and bread.  He points to lower sternal area as site of bolus obstruction.  He states he has had episodes of food impaction but not recently.  His esophagus has been dilated in the past.  He recalls that he has small sliding hiatal hernia.  He is on pantoprazole and has heartburn once or twice a week.  He watches his diet. He is also undergoing screening colonoscopy.  There is no history of diarrhea constipation or rectal bleeding except he noted blood in the tissue after he took the prep. Family history is negative for CRC.  Past Medical History:  Diagnosis Date  . GERD (gastroesophageal reflux disease)   . Headache    migranes  . Hypertension     Past Surgical History:  Procedure Laterality Date  . EYE SURGERY     bilateral  . KNEE CARTILAGE SURGERY Right     History reviewed. No pertinent family history. Social History:  reports that he has never smoked. He has never used smokeless tobacco. He reports that he does not drink alcohol or use drugs.  Allergies:  Allergies  Allergen Reactions  . Iodine-131 Anaphylaxis  . Reglan [Metoclopramide] Other (See Comments)    Hyperactivity     Medications Prior to Admission  Medication Sig Dispense Refill  . nebivolol (BYSTOLIC) 5 MG tablet Take 5 mg by mouth daily.    . nortriptyline (PAMELOR) 10 MG capsule Take 20 mg by mouth at bedtime.    . pantoprazole (PROTONIX) 40 MG tablet Take 40 mg by mouth daily.    . prednisoLONE acetate (PRED FORTE) 1 % ophthalmic suspension Place 1 drop into both eyes 2 (two) times daily as needed (irritation to eye).    . SUMAtriptan (IMITREX) 50 MG tablet Take 50 mg by mouth every 2 (two) hours as needed for  migraine. May repeat in 2 hours if headache persists or recurs.    . valACYclovir (VALTREX) 500 MG tablet Take 500 mg by mouth daily.      No results found for this or any previous visit (from the past 48 hour(s)). No results found.  ROS  Blood pressure (!) 145/77, pulse 79, temperature 98.7 F (37.1 C), temperature source Oral, resp. rate (!) 21, height 5\' 9"  (1.753 m), weight 99.8 kg, SpO2 97 %. Physical Exam  Constitutional: He appears well-developed and well-nourished.  HENT:  Mouth/Throat: Oropharynx is clear and moist.  Eyes: Conjunctivae are normal. No scleral icterus.  Neck: No thyromegaly present.  Cardiovascular: Normal rate, regular rhythm and normal heart sounds.  No murmur heard. Respiratory: Effort normal and breath sounds normal.  GI: Soft. He exhibits no distension and no mass. There is no tenderness.  Musculoskeletal: He exhibits no edema.  Lymphadenopathy:    He has no cervical adenopathy.  Neurological: He is alert.  Skin: Skin is warm and dry.     Assessment/Plan Chronic GERD. Solid food dysphagia. EGD with ED and average or screening colonoscopy.  Hildred Laser, MD 01/28/2018, 7:33 AM

## 2018-02-01 ENCOUNTER — Encounter (HOSPITAL_COMMUNITY): Payer: Self-pay | Admitting: Internal Medicine

## 2018-02-04 MED FILL — NORTRIPTYLINE HCL 10 MG CAP: 10 | 90 days supply | Qty: 180 | Fill #2

## 2018-02-11 MED FILL — VALACYCLOVIR HCL 500 MG TAB: 500 | 30 days supply | Qty: 30 | Fill #1

## 2018-02-11 MED FILL — BYSTOLIC 5 MG TABLET: 5 | 90 days supply | Qty: 90 | Fill #3

## 2018-02-15 DIAGNOSIS — H35033 Hypertensive retinopathy, bilateral: Secondary | ICD-10-CM | POA: Diagnosis not present

## 2018-02-15 DIAGNOSIS — H3554 Dystrophies primarily involving the retinal pigment epithelium: Secondary | ICD-10-CM | POA: Diagnosis not present

## 2018-02-15 DIAGNOSIS — H21542 Posterior synechiae (iris), left eye: Secondary | ICD-10-CM | POA: Diagnosis not present

## 2018-02-15 DIAGNOSIS — B0051 Herpesviral iridocyclitis: Secondary | ICD-10-CM | POA: Diagnosis not present

## 2018-03-07 MED FILL — VALACYCLOVIR HCL 500 MG TAB: 500 | 30 days supply | Qty: 30 | Fill #2

## 2018-04-05 MED FILL — VALACYCLOVIR HCL 500 MG TAB: 500 | 90 days supply | Qty: 90 | Fill #0

## 2018-04-18 MED FILL — PANTOPRAZOLE SOD DR 40 MG T: 40 | 90 days supply | Qty: 90 | Fill #2

## 2018-05-01 MED FILL — NORTRIPTYLINE HCL 10 MG CAP: 10 | 90 days supply | Qty: 180 | Fill #3

## 2018-05-22 MED FILL — BYSTOLIC 5 MG TABLET: 5 | 90 days supply | Qty: 90 | Fill #4

## 2018-07-08 MED FILL — VALACYCLOVIR HCL 500 MG TAB: 500 | 90 days supply | Qty: 90 | Fill #1 | Status: TO

## 2018-07-15 MED FILL — PANTOPRAZOLE SOD DR 40 MG T: 40 | 90 days supply | Qty: 90 | Fill #3

## 2018-07-22 MED FILL — NORTRIPTYLINE HCL 10 MG CAP: 10 | 90 days supply | Qty: 180 | Fill #4

## 2018-08-14 MED FILL — BYSTOLIC 5 MG TABLET: 5 | 90 days supply | Qty: 90 | Fill #0

## 2018-09-20 DIAGNOSIS — Z125 Encounter for screening for malignant neoplasm of prostate: Secondary | ICD-10-CM | POA: Diagnosis not present

## 2018-09-20 DIAGNOSIS — Z79899 Other long term (current) drug therapy: Secondary | ICD-10-CM | POA: Diagnosis not present

## 2018-09-20 DIAGNOSIS — I10 Essential (primary) hypertension: Secondary | ICD-10-CM | POA: Diagnosis not present

## 2018-09-20 DIAGNOSIS — R5383 Other fatigue: Secondary | ICD-10-CM | POA: Diagnosis not present

## 2018-09-20 DIAGNOSIS — Z299 Encounter for prophylactic measures, unspecified: Secondary | ICD-10-CM | POA: Diagnosis not present

## 2018-09-20 DIAGNOSIS — Z6832 Body mass index (BMI) 32.0-32.9, adult: Secondary | ICD-10-CM | POA: Diagnosis not present

## 2018-09-20 DIAGNOSIS — Z1331 Encounter for screening for depression: Secondary | ICD-10-CM | POA: Diagnosis not present

## 2018-09-20 DIAGNOSIS — Z0000 Encounter for general adult medical examination without abnormal findings: Secondary | ICD-10-CM | POA: Diagnosis not present

## 2018-09-20 MED FILL — SUMATRIPTAN SUCC 50 MG TAB: 50 | 30 days supply | Qty: 15 | Fill #0

## 2018-09-25 MED FILL — PANTOPRAZOLE SOD DR 40 MG T: 40 | 90 days supply | Qty: 90 | Fill #0

## 2018-09-27 MED FILL — VALACYCLOVIR HCL 500 MG TAB: 500 | 90 days supply | Qty: 90 | Fill #0

## 2018-10-02 MED FILL — NORTRIPTYLINE HCL 10 MG CAP: 10 | 90 days supply | Qty: 180 | Fill #0

## 2018-10-24 MED FILL — BYSTOLIC 5 MG TABLET: 5 | 90 days supply | Qty: 90 | Fill #0

## 2018-12-24 MED FILL — PANTOPRAZOLE SOD DR 40 MG T: 40 | 90 days supply | Qty: 90 | Fill #1

## 2018-12-24 MED FILL — VALACYCLOVIR HCL 500 MG TAB: 500 | 90 days supply | Qty: 90 | Fill #1

## 2018-12-30 DIAGNOSIS — Z299 Encounter for prophylactic measures, unspecified: Secondary | ICD-10-CM | POA: Diagnosis not present

## 2018-12-30 DIAGNOSIS — I10 Essential (primary) hypertension: Secondary | ICD-10-CM | POA: Diagnosis not present

## 2018-12-30 DIAGNOSIS — Z6827 Body mass index (BMI) 27.0-27.9, adult: Secondary | ICD-10-CM | POA: Diagnosis not present

## 2018-12-30 DIAGNOSIS — Z713 Dietary counseling and surveillance: Secondary | ICD-10-CM | POA: Diagnosis not present

## 2019-01-17 MED FILL — NORTRIPTYLINE HCL 10 MG CAP: 10 | 90 days supply | Qty: 180 | Fill #1

## 2019-01-29 MED FILL — BYSTOLIC 5 MG TABLET: 5 | 90 days supply | Qty: 90 | Fill #1

## 2019-01-30 DIAGNOSIS — Z299 Encounter for prophylactic measures, unspecified: Secondary | ICD-10-CM | POA: Diagnosis not present

## 2019-01-30 DIAGNOSIS — Z713 Dietary counseling and surveillance: Secondary | ICD-10-CM | POA: Diagnosis not present

## 2019-01-30 DIAGNOSIS — Z789 Other specified health status: Secondary | ICD-10-CM | POA: Diagnosis not present

## 2019-01-30 DIAGNOSIS — I10 Essential (primary) hypertension: Secondary | ICD-10-CM | POA: Diagnosis not present

## 2019-01-30 DIAGNOSIS — Z6827 Body mass index (BMI) 27.0-27.9, adult: Secondary | ICD-10-CM | POA: Diagnosis not present

## 2019-02-18 DIAGNOSIS — H21542 Posterior synechiae (iris), left eye: Secondary | ICD-10-CM | POA: Diagnosis not present

## 2019-02-18 DIAGNOSIS — B0051 Herpesviral iridocyclitis: Secondary | ICD-10-CM | POA: Diagnosis not present

## 2019-02-18 DIAGNOSIS — H3554 Dystrophies primarily involving the retinal pigment epithelium: Secondary | ICD-10-CM | POA: Diagnosis not present

## 2019-02-18 DIAGNOSIS — H35033 Hypertensive retinopathy, bilateral: Secondary | ICD-10-CM | POA: Diagnosis not present

## 2019-03-31 MED FILL — VALACYCLOVIR HCL 500 MG TAB: 500 | 30 days supply | Qty: 30 | Fill #0

## 2019-03-31 MED FILL — PANTOPRAZOLE SOD DR 40 MG T: 40 | 90 days supply | Qty: 90 | Fill #2

## 2019-04-18 MED FILL — NORTRIPTYLINE HCL 10 MG CAP: 10 | 90 days supply | Qty: 180 | Fill #2

## 2019-04-21 MED FILL — BYSTOLIC 5 MG TABLET: 5 | 90 days supply | Qty: 90 | Fill #2

## 2019-04-25 MED FILL — VALACYCLOVIR HCL 500 MG TAB: 500 | 30 days supply | Qty: 30 | Fill #1

## 2019-05-23 MED FILL — VALACYCLOVIR HCL 500 MG TAB: 500 | 30 days supply | Qty: 30 | Fill #2

## 2019-06-28 MED FILL — PANTOPRAZOLE SOD DR 40 MG T: 40 | 90 days supply | Qty: 90 | Fill #3

## 2019-06-30 MED FILL — VALACYCLOVIR HCL 500 MG TAB: 500 | 30 days supply | Qty: 30 | Fill #3

## 2019-07-16 DIAGNOSIS — H52203 Unspecified astigmatism, bilateral: Secondary | ICD-10-CM | POA: Diagnosis not present

## 2019-07-16 DIAGNOSIS — H524 Presbyopia: Secondary | ICD-10-CM | POA: Diagnosis not present

## 2019-07-16 DIAGNOSIS — H5213 Myopia, bilateral: Secondary | ICD-10-CM | POA: Diagnosis not present

## 2019-07-23 MED FILL — BYSTOLIC 5 MG TABLET: 5 | 90 days supply | Qty: 90 | Fill #3

## 2019-07-24 MED FILL — VALACYCLOVIR HCL 500 MG TAB: 500 | 30 days supply | Qty: 30 | Fill #4

## 2019-08-18 MED FILL — NORTRIPTYLINE HCL 10 MG CAP: 10 | 90 days supply | Qty: 180 | Fill #3

## 2019-08-22 DIAGNOSIS — Z23 Encounter for immunization: Secondary | ICD-10-CM | POA: Diagnosis not present

## 2019-08-25 MED FILL — VALACYCLOVIR HCL 500 MG TAB: 500 | 30 days supply | Qty: 30 | Fill #5

## 2019-09-10 DIAGNOSIS — Z23 Encounter for immunization: Secondary | ICD-10-CM | POA: Diagnosis not present

## 2019-09-30 ENCOUNTER — Other Ambulatory Visit (HOSPITAL_COMMUNITY): Payer: Self-pay | Admitting: Internal Medicine

## 2019-09-30 DIAGNOSIS — R5383 Other fatigue: Secondary | ICD-10-CM | POA: Diagnosis not present

## 2019-09-30 DIAGNOSIS — Z79899 Other long term (current) drug therapy: Secondary | ICD-10-CM | POA: Diagnosis not present

## 2019-09-30 DIAGNOSIS — Z6827 Body mass index (BMI) 27.0-27.9, adult: Secondary | ICD-10-CM | POA: Diagnosis not present

## 2019-09-30 DIAGNOSIS — Z1331 Encounter for screening for depression: Secondary | ICD-10-CM | POA: Diagnosis not present

## 2019-09-30 DIAGNOSIS — R519 Headache, unspecified: Secondary | ICD-10-CM | POA: Diagnosis not present

## 2019-09-30 DIAGNOSIS — Z299 Encounter for prophylactic measures, unspecified: Secondary | ICD-10-CM | POA: Diagnosis not present

## 2019-09-30 DIAGNOSIS — Z1211 Encounter for screening for malignant neoplasm of colon: Secondary | ICD-10-CM | POA: Diagnosis not present

## 2019-09-30 DIAGNOSIS — I10 Essential (primary) hypertension: Secondary | ICD-10-CM | POA: Diagnosis not present

## 2019-09-30 DIAGNOSIS — Z125 Encounter for screening for malignant neoplasm of prostate: Secondary | ICD-10-CM | POA: Diagnosis not present

## 2019-09-30 DIAGNOSIS — K219 Gastro-esophageal reflux disease without esophagitis: Secondary | ICD-10-CM | POA: Diagnosis not present

## 2019-09-30 DIAGNOSIS — Z0000 Encounter for general adult medical examination without abnormal findings: Secondary | ICD-10-CM | POA: Diagnosis not present

## 2019-09-30 MED FILL — PANTOPRAZOLE SOD DR 40 MG T: 40 | 90 days supply | Qty: 90 | Fill #0

## 2019-10-04 MED FILL — BYSTOLIC 5 MG TABLET: 5 | 90 days supply | Qty: 90 | Fill #0

## 2019-11-17 MED FILL — VALACYCLOVIR HCL 500 MG TAB: 500 | 30 days supply | Qty: 30 | Fill #8

## 2019-12-16 MED FILL — VALACYCLOVIR HCL 500 MG TAB: 500 | 30 days supply | Qty: 30 | Fill #9

## 2019-12-30 MED FILL — PANTOPRAZOLE SOD DR 40 MG T: 40 | 90 days supply | Qty: 90 | Fill #1

## 2020-01-12 MED FILL — VALACYCLOVIR HCL 500 MG TAB: 500 | 30 days supply | Qty: 30 | Fill #10

## 2020-01-12 MED FILL — BYSTOLIC 5 MG TABLET: 5 | 90 days supply | Qty: 90 | Fill #1

## 2020-01-12 MED FILL — NORTRIPTYLINE HCL 10 MG CAP: 10 | 90 days supply | Qty: 180 | Fill #0

## 2020-02-10 MED FILL — VALACYCLOVIR HCL 500 MG TAB: 500 | 30 days supply | Qty: 30 | Fill #11

## 2020-03-10 ENCOUNTER — Other Ambulatory Visit (HOSPITAL_COMMUNITY): Payer: Self-pay | Admitting: Ophthalmology

## 2020-03-10 MED FILL — VALACYCLOVIR HCL 500 MG TAB: 500 | 30 days supply | Qty: 30 | Fill #0

## 2020-04-10 MED FILL — VALACYCLOVIR HCL 500 MG TAB: 500 | 30 days supply | Qty: 30 | Fill #1

## 2020-04-21 MED FILL — NEBIVOLOL HCL 5 MG TABS: 5 | 90 days supply | Qty: 90 | Fill #2

## 2020-05-16 MED FILL — VALACYCLOVIR HCL 500 MG TAB: 500 | 30 days supply | Qty: 30 | Fill #2

## 2020-05-16 MED FILL — PANTOPRAZOLE SOD DR 40 MG T: 40 | 90 days supply | Qty: 90 | Fill #2

## 2020-06-04 ENCOUNTER — Other Ambulatory Visit (HOSPITAL_COMMUNITY): Payer: Self-pay | Admitting: Internal Medicine

## 2020-06-04 DIAGNOSIS — J069 Acute upper respiratory infection, unspecified: Secondary | ICD-10-CM | POA: Diagnosis not present

## 2020-06-04 DIAGNOSIS — I10 Essential (primary) hypertension: Secondary | ICD-10-CM | POA: Diagnosis not present

## 2020-06-04 DIAGNOSIS — Z299 Encounter for prophylactic measures, unspecified: Secondary | ICD-10-CM | POA: Diagnosis not present

## 2020-06-04 DIAGNOSIS — K219 Gastro-esophageal reflux disease without esophagitis: Secondary | ICD-10-CM | POA: Diagnosis not present

## 2020-06-04 MED FILL — predniSONE 10 MG TABS: 10 | 7 days supply | Qty: 14 | Fill #0

## 2020-06-04 MED FILL — AMOXICILLIN 500 MG CAPSULE: 500 | 7 days supply | Qty: 21 | Fill #0

## 2020-06-13 MED FILL — VALACYCLOVIR HCL 500 MG TAB: 500 | 30 days supply | Qty: 30 | Fill #3

## 2020-06-13 MED FILL — NORTRIPTYLINE HCL 10 MG CAP: 10 | 90 days supply | Qty: 180 | Fill #1

## 2020-06-29 ENCOUNTER — Other Ambulatory Visit (HOSPITAL_COMMUNITY): Payer: Self-pay | Admitting: Ophthalmology

## 2020-06-29 DIAGNOSIS — H3554 Dystrophies primarily involving the retinal pigment epithelium: Secondary | ICD-10-CM | POA: Diagnosis not present

## 2020-06-29 DIAGNOSIS — B0051 Herpesviral iridocyclitis: Secondary | ICD-10-CM | POA: Diagnosis not present

## 2020-06-29 DIAGNOSIS — H21542 Posterior synechiae (iris), left eye: Secondary | ICD-10-CM | POA: Diagnosis not present

## 2020-06-29 DIAGNOSIS — H35033 Hypertensive retinopathy, bilateral: Secondary | ICD-10-CM | POA: Diagnosis not present

## 2020-07-12 ENCOUNTER — Other Ambulatory Visit (HOSPITAL_COMMUNITY): Payer: Self-pay | Admitting: Ophthalmology

## 2020-07-12 MED FILL — VALACYCLOVIR HCL 500 MG TAB: 500 | 90 days supply | Qty: 90 | Fill #0

## 2020-07-20 MED FILL — NEBIVOLOL HCL 5 MG TABS: 5 | 90 days supply | Qty: 90 | Fill #3

## 2020-08-03 ENCOUNTER — Other Ambulatory Visit (HOSPITAL_BASED_OUTPATIENT_CLINIC_OR_DEPARTMENT_OTHER): Payer: Self-pay

## 2020-08-10 MED FILL — PANTOPRAZOLE SOD DR 40 MG T: 40 | 90 days supply | Qty: 90 | Fill #3

## 2020-09-29 ENCOUNTER — Other Ambulatory Visit (HOSPITAL_COMMUNITY): Payer: Self-pay

## 2020-09-29 MED FILL — Nebivolol HCl Tab 5 MG (Base Equivalent): ORAL | 90 days supply | Qty: 90 | Fill #0 | Status: CN

## 2020-09-30 ENCOUNTER — Other Ambulatory Visit (HOSPITAL_COMMUNITY): Payer: Self-pay

## 2020-10-02 ENCOUNTER — Other Ambulatory Visit (HOSPITAL_COMMUNITY): Payer: Self-pay

## 2020-10-02 MED FILL — Valacyclovir HCl Tab 500 MG: ORAL | 90 days supply | Qty: 90 | Fill #0 | Status: AC

## 2020-10-04 ENCOUNTER — Other Ambulatory Visit (HOSPITAL_COMMUNITY): Payer: Self-pay

## 2020-10-04 MED ORDER — NEBIVOLOL HCL 5 MG PO TABS
5.0000 mg | ORAL_TABLET | Freq: Every day | ORAL | 4 refills | Status: DC
  Filled 2020-10-04: qty 90, 90d supply, fill #0
  Filled 2020-12-30: qty 90, 90d supply, fill #1
  Filled 2021-03-23: qty 90, 90d supply, fill #2
  Filled 2021-06-20: qty 90, 90d supply, fill #3
  Filled 2021-09-12: qty 90, 90d supply, fill #4

## 2020-10-07 ENCOUNTER — Other Ambulatory Visit (HOSPITAL_COMMUNITY): Payer: Self-pay

## 2020-10-07 DIAGNOSIS — Z299 Encounter for prophylactic measures, unspecified: Secondary | ICD-10-CM | POA: Diagnosis not present

## 2020-10-07 DIAGNOSIS — Z6828 Body mass index (BMI) 28.0-28.9, adult: Secondary | ICD-10-CM | POA: Diagnosis not present

## 2020-10-07 DIAGNOSIS — Z125 Encounter for screening for malignant neoplasm of prostate: Secondary | ICD-10-CM | POA: Diagnosis not present

## 2020-10-07 DIAGNOSIS — R5383 Other fatigue: Secondary | ICD-10-CM | POA: Diagnosis not present

## 2020-10-07 DIAGNOSIS — I10 Essential (primary) hypertension: Secondary | ICD-10-CM | POA: Diagnosis not present

## 2020-10-07 DIAGNOSIS — E7800 Pure hypercholesterolemia, unspecified: Secondary | ICD-10-CM | POA: Diagnosis not present

## 2020-10-07 DIAGNOSIS — Z0000 Encounter for general adult medical examination without abnormal findings: Secondary | ICD-10-CM | POA: Diagnosis not present

## 2020-10-07 DIAGNOSIS — Z79899 Other long term (current) drug therapy: Secondary | ICD-10-CM | POA: Diagnosis not present

## 2020-10-07 DIAGNOSIS — Z1331 Encounter for screening for depression: Secondary | ICD-10-CM | POA: Diagnosis not present

## 2020-10-07 MED ORDER — PANTOPRAZOLE SODIUM 40 MG PO TBEC
DELAYED_RELEASE_TABLET | ORAL | 3 refills | Status: DC
  Filled 2020-10-07: qty 90, 90d supply, fill #0

## 2020-10-07 MED ORDER — NORTRIPTYLINE HCL 10 MG PO CAPS
ORAL_CAPSULE | ORAL | 4 refills | Status: DC
  Filled 2020-10-07: qty 180, 90d supply, fill #0
  Filled 2020-12-30: qty 180, 90d supply, fill #1
  Filled 2021-03-29: qty 180, 90d supply, fill #2
  Filled 2021-06-20: qty 180, 90d supply, fill #3
  Filled 2021-09-12: qty 180, 90d supply, fill #4

## 2020-11-16 ENCOUNTER — Other Ambulatory Visit (HOSPITAL_COMMUNITY): Payer: Self-pay

## 2020-11-17 ENCOUNTER — Other Ambulatory Visit (HOSPITAL_COMMUNITY): Payer: Self-pay

## 2020-11-17 MED ORDER — PANTOPRAZOLE SODIUM 40 MG PO TBEC
40.0000 mg | DELAYED_RELEASE_TABLET | Freq: Every day | ORAL | 3 refills | Status: DC
  Filled 2020-11-17: qty 90, 90d supply, fill #0
  Filled 2021-03-23: qty 90, 90d supply, fill #1
  Filled 2021-06-20: qty 90, 90d supply, fill #2
  Filled 2021-09-12: qty 90, 90d supply, fill #3

## 2020-11-18 ENCOUNTER — Other Ambulatory Visit (HOSPITAL_COMMUNITY): Payer: Self-pay

## 2020-12-30 ENCOUNTER — Other Ambulatory Visit (HOSPITAL_COMMUNITY): Payer: Self-pay

## 2020-12-30 MED FILL — Valacyclovir HCl Tab 500 MG: ORAL | 90 days supply | Qty: 90 | Fill #1 | Status: AC

## 2021-02-01 DIAGNOSIS — I10 Essential (primary) hypertension: Secondary | ICD-10-CM | POA: Diagnosis not present

## 2021-02-01 DIAGNOSIS — H6121 Impacted cerumen, right ear: Secondary | ICD-10-CM | POA: Diagnosis not present

## 2021-02-01 DIAGNOSIS — Z299 Encounter for prophylactic measures, unspecified: Secondary | ICD-10-CM | POA: Diagnosis not present

## 2021-02-08 DIAGNOSIS — Z299 Encounter for prophylactic measures, unspecified: Secondary | ICD-10-CM | POA: Diagnosis not present

## 2021-02-08 DIAGNOSIS — R519 Headache, unspecified: Secondary | ICD-10-CM | POA: Diagnosis not present

## 2021-02-08 DIAGNOSIS — I10 Essential (primary) hypertension: Secondary | ICD-10-CM | POA: Diagnosis not present

## 2021-02-09 ENCOUNTER — Other Ambulatory Visit: Payer: Self-pay

## 2021-02-09 ENCOUNTER — Other Ambulatory Visit: Payer: Self-pay | Admitting: Internal Medicine

## 2021-02-09 ENCOUNTER — Ambulatory Visit (HOSPITAL_COMMUNITY)
Admission: RE | Admit: 2021-02-09 | Discharge: 2021-02-09 | Disposition: A | Discharge status: Home / Self Care | Payer: 59 | Source: Ambulatory Visit | Attending: Internal Medicine | Admitting: Internal Medicine

## 2021-02-09 ENCOUNTER — Other Ambulatory Visit (HOSPITAL_COMMUNITY): Payer: Self-pay | Admitting: Internal Medicine

## 2021-02-09 DIAGNOSIS — G43901 Migraine, unspecified, not intractable, with status migrainosus: Secondary | ICD-10-CM | POA: Diagnosis not present

## 2021-02-09 DIAGNOSIS — R519 Headache, unspecified: Secondary | ICD-10-CM | POA: Diagnosis not present

## 2021-03-23 ENCOUNTER — Other Ambulatory Visit (HOSPITAL_COMMUNITY): Payer: Self-pay

## 2021-03-23 MED FILL — Valacyclovir HCl Tab 500 MG: ORAL | 90 days supply | Qty: 90 | Fill #2 | Status: AC

## 2021-03-29 ENCOUNTER — Other Ambulatory Visit (HOSPITAL_COMMUNITY): Payer: Self-pay

## 2021-04-06 ENCOUNTER — Other Ambulatory Visit (HOSPITAL_COMMUNITY): Payer: Self-pay

## 2021-04-13 DIAGNOSIS — I10 Essential (primary) hypertension: Secondary | ICD-10-CM | POA: Diagnosis not present

## 2021-04-13 DIAGNOSIS — G4700 Insomnia, unspecified: Secondary | ICD-10-CM | POA: Diagnosis not present

## 2021-04-13 DIAGNOSIS — R0789 Other chest pain: Secondary | ICD-10-CM | POA: Diagnosis not present

## 2021-04-13 DIAGNOSIS — Z299 Encounter for prophylactic measures, unspecified: Secondary | ICD-10-CM | POA: Diagnosis not present

## 2021-04-13 DIAGNOSIS — F419 Anxiety disorder, unspecified: Secondary | ICD-10-CM | POA: Diagnosis not present

## 2021-05-19 DIAGNOSIS — R350 Frequency of micturition: Secondary | ICD-10-CM | POA: Diagnosis not present

## 2021-05-19 DIAGNOSIS — Z6829 Body mass index (BMI) 29.0-29.9, adult: Secondary | ICD-10-CM | POA: Diagnosis not present

## 2021-05-19 DIAGNOSIS — I10 Essential (primary) hypertension: Secondary | ICD-10-CM | POA: Diagnosis not present

## 2021-05-19 DIAGNOSIS — U071 COVID-19: Secondary | ICD-10-CM | POA: Diagnosis not present

## 2021-05-19 DIAGNOSIS — Z299 Encounter for prophylactic measures, unspecified: Secondary | ICD-10-CM | POA: Diagnosis not present

## 2021-06-09 ENCOUNTER — Other Ambulatory Visit (HOSPITAL_COMMUNITY): Payer: Self-pay

## 2021-06-14 DIAGNOSIS — Z299 Encounter for prophylactic measures, unspecified: Secondary | ICD-10-CM | POA: Diagnosis not present

## 2021-06-14 DIAGNOSIS — F419 Anxiety disorder, unspecified: Secondary | ICD-10-CM | POA: Diagnosis not present

## 2021-06-14 DIAGNOSIS — Z6829 Body mass index (BMI) 29.0-29.9, adult: Secondary | ICD-10-CM | POA: Diagnosis not present

## 2021-06-14 DIAGNOSIS — E7800 Pure hypercholesterolemia, unspecified: Secondary | ICD-10-CM | POA: Diagnosis not present

## 2021-06-14 DIAGNOSIS — I10 Essential (primary) hypertension: Secondary | ICD-10-CM | POA: Diagnosis not present

## 2021-06-20 MED FILL — Valacyclovir HCl Tab 500 MG: ORAL | 90 days supply | Qty: 90 | Fill #3 | Status: AC

## 2021-06-21 ENCOUNTER — Other Ambulatory Visit (HOSPITAL_COMMUNITY): Payer: Self-pay

## 2021-06-22 ENCOUNTER — Other Ambulatory Visit (HOSPITAL_COMMUNITY): Payer: Self-pay

## 2021-07-01 DIAGNOSIS — I10 Essential (primary) hypertension: Secondary | ICD-10-CM | POA: Diagnosis not present

## 2021-07-01 DIAGNOSIS — Z6829 Body mass index (BMI) 29.0-29.9, adult: Secondary | ICD-10-CM | POA: Diagnosis not present

## 2021-07-01 DIAGNOSIS — Z299 Encounter for prophylactic measures, unspecified: Secondary | ICD-10-CM | POA: Diagnosis not present

## 2021-07-01 DIAGNOSIS — R350 Frequency of micturition: Secondary | ICD-10-CM | POA: Diagnosis not present

## 2021-07-01 DIAGNOSIS — J069 Acute upper respiratory infection, unspecified: Secondary | ICD-10-CM | POA: Diagnosis not present

## 2021-08-30 DIAGNOSIS — H3554 Dystrophies primarily involving the retinal pigment epithelium: Secondary | ICD-10-CM | POA: Diagnosis not present

## 2021-08-30 DIAGNOSIS — H21542 Posterior synechiae (iris), left eye: Secondary | ICD-10-CM | POA: Diagnosis not present

## 2021-08-30 DIAGNOSIS — H35033 Hypertensive retinopathy, bilateral: Secondary | ICD-10-CM | POA: Diagnosis not present

## 2021-08-30 DIAGNOSIS — B0051 Herpesviral iridocyclitis: Secondary | ICD-10-CM | POA: Diagnosis not present

## 2021-09-12 ENCOUNTER — Other Ambulatory Visit (HOSPITAL_COMMUNITY): Payer: Self-pay

## 2021-09-13 ENCOUNTER — Other Ambulatory Visit (HOSPITAL_COMMUNITY): Payer: Self-pay

## 2021-09-13 MED ORDER — VALACYCLOVIR HCL 500 MG PO TABS
500.0000 mg | ORAL_TABLET | Freq: Every day | ORAL | 3 refills | Status: DC
  Filled 2021-09-13: qty 90, 90d supply, fill #0
  Filled 2021-12-07: qty 90, 90d supply, fill #1
  Filled 2022-03-06: qty 90, 90d supply, fill #2
  Filled 2022-06-05: qty 90, 90d supply, fill #3

## 2021-10-11 ENCOUNTER — Other Ambulatory Visit (HOSPITAL_COMMUNITY): Payer: Self-pay

## 2021-10-11 DIAGNOSIS — I10 Essential (primary) hypertension: Secondary | ICD-10-CM | POA: Diagnosis not present

## 2021-10-11 DIAGNOSIS — Z125 Encounter for screening for malignant neoplasm of prostate: Secondary | ICD-10-CM | POA: Diagnosis not present

## 2021-10-11 DIAGNOSIS — Z6829 Body mass index (BMI) 29.0-29.9, adult: Secondary | ICD-10-CM | POA: Diagnosis not present

## 2021-10-11 DIAGNOSIS — Z1331 Encounter for screening for depression: Secondary | ICD-10-CM | POA: Diagnosis not present

## 2021-10-11 DIAGNOSIS — Z0000 Encounter for general adult medical examination without abnormal findings: Secondary | ICD-10-CM | POA: Diagnosis not present

## 2021-10-11 DIAGNOSIS — Z79899 Other long term (current) drug therapy: Secondary | ICD-10-CM | POA: Diagnosis not present

## 2021-10-11 DIAGNOSIS — Z299 Encounter for prophylactic measures, unspecified: Secondary | ICD-10-CM | POA: Diagnosis not present

## 2021-10-11 DIAGNOSIS — E7800 Pure hypercholesterolemia, unspecified: Secondary | ICD-10-CM | POA: Diagnosis not present

## 2021-10-11 DIAGNOSIS — F419 Anxiety disorder, unspecified: Secondary | ICD-10-CM | POA: Diagnosis not present

## 2021-10-11 MED ORDER — NEBIVOLOL HCL 5 MG PO TABS
5.0000 mg | ORAL_TABLET | Freq: Every day | ORAL | 4 refills | Status: DC
  Filled 2021-10-11 – 2021-12-07 (×2): qty 90, 90d supply, fill #0
  Filled 2022-03-06: qty 90, 90d supply, fill #1
  Filled 2022-06-05: qty 90, 90d supply, fill #2
  Filled 2022-09-01: qty 90, 90d supply, fill #3

## 2021-10-11 MED ORDER — PANTOPRAZOLE SODIUM 40 MG PO TBEC
DELAYED_RELEASE_TABLET | ORAL | 3 refills | Status: DC
  Filled 2021-12-07: qty 90, 90d supply, fill #0

## 2021-10-11 MED ORDER — SUMATRIPTAN SUCCINATE 50 MG PO TABS
ORAL_TABLET | ORAL | 4 refills | Status: DC
  Filled 2021-10-11: qty 45, 90d supply, fill #0

## 2021-10-11 MED ORDER — NORTRIPTYLINE HCL 10 MG PO CAPS
ORAL_CAPSULE | ORAL | 4 refills | Status: DC
  Filled 2021-12-07: qty 180, 90d supply, fill #0
  Filled 2022-03-06: qty 180, 90d supply, fill #1
  Filled 2022-06-05: qty 180, 90d supply, fill #2
  Filled 2022-09-01: qty 180, 90d supply, fill #3

## 2021-11-28 DIAGNOSIS — H6690 Otitis media, unspecified, unspecified ear: Secondary | ICD-10-CM | POA: Diagnosis not present

## 2021-11-28 DIAGNOSIS — Z299 Encounter for prophylactic measures, unspecified: Secondary | ICD-10-CM | POA: Diagnosis not present

## 2021-11-28 DIAGNOSIS — I10 Essential (primary) hypertension: Secondary | ICD-10-CM | POA: Diagnosis not present

## 2021-12-07 ENCOUNTER — Other Ambulatory Visit (HOSPITAL_COMMUNITY): Payer: Self-pay

## 2022-02-21 DIAGNOSIS — H9042 Sensorineural hearing loss, unilateral, left ear, with unrestricted hearing on the contralateral side: Secondary | ICD-10-CM | POA: Diagnosis not present

## 2022-02-21 DIAGNOSIS — J342 Deviated nasal septum: Secondary | ICD-10-CM | POA: Diagnosis not present

## 2022-02-21 DIAGNOSIS — J343 Hypertrophy of nasal turbinates: Secondary | ICD-10-CM | POA: Diagnosis not present

## 2022-02-21 DIAGNOSIS — H9201 Otalgia, right ear: Secondary | ICD-10-CM | POA: Diagnosis not present

## 2022-02-21 DIAGNOSIS — J310 Chronic rhinitis: Secondary | ICD-10-CM | POA: Diagnosis not present

## 2022-03-06 ENCOUNTER — Other Ambulatory Visit (HOSPITAL_COMMUNITY): Payer: Self-pay

## 2022-03-07 ENCOUNTER — Other Ambulatory Visit (HOSPITAL_COMMUNITY): Payer: Self-pay

## 2022-03-08 ENCOUNTER — Other Ambulatory Visit (HOSPITAL_COMMUNITY): Payer: Self-pay

## 2022-03-25 ENCOUNTER — Encounter (INDEPENDENT_AMBULATORY_CARE_PROVIDER_SITE_OTHER): Payer: Self-pay | Admitting: Gastroenterology

## 2022-04-14 DIAGNOSIS — M25562 Pain in left knee: Secondary | ICD-10-CM | POA: Diagnosis not present

## 2022-04-14 DIAGNOSIS — M25561 Pain in right knee: Secondary | ICD-10-CM | POA: Diagnosis not present

## 2022-04-14 DIAGNOSIS — M23200 Derangement of unspecified lateral meniscus due to old tear or injury, right knee: Secondary | ICD-10-CM | POA: Diagnosis not present

## 2022-04-28 ENCOUNTER — Other Ambulatory Visit: Payer: Self-pay

## 2022-04-28 ENCOUNTER — Other Ambulatory Visit (HOSPITAL_COMMUNITY): Payer: Self-pay

## 2022-04-28 MED ORDER — DIAZEPAM 5 MG PO TABS
ORAL_TABLET | ORAL | 0 refills | Status: DC
  Filled 2022-04-28 (×2): qty 2, 1d supply, fill #0

## 2022-06-05 ENCOUNTER — Other Ambulatory Visit: Payer: Self-pay

## 2022-06-05 ENCOUNTER — Other Ambulatory Visit (HOSPITAL_COMMUNITY): Payer: Self-pay

## 2022-07-14 ENCOUNTER — Other Ambulatory Visit (HOSPITAL_COMMUNITY): Payer: Self-pay

## 2022-07-14 DIAGNOSIS — Z299 Encounter for prophylactic measures, unspecified: Secondary | ICD-10-CM | POA: Diagnosis not present

## 2022-07-14 DIAGNOSIS — I10 Essential (primary) hypertension: Secondary | ICD-10-CM | POA: Diagnosis not present

## 2022-07-14 DIAGNOSIS — M5416 Radiculopathy, lumbar region: Secondary | ICD-10-CM | POA: Diagnosis not present

## 2022-07-14 DIAGNOSIS — M545 Low back pain, unspecified: Secondary | ICD-10-CM | POA: Diagnosis not present

## 2022-07-14 DIAGNOSIS — H209 Unspecified iridocyclitis: Secondary | ICD-10-CM | POA: Diagnosis not present

## 2022-07-14 MED ORDER — BACLOFEN 10 MG PO TABS
10.0000 mg | ORAL_TABLET | Freq: Two times a day (BID) | ORAL | 0 refills | Status: DC
  Filled 2022-07-14: qty 20, 10d supply, fill #0

## 2022-07-15 ENCOUNTER — Other Ambulatory Visit (HOSPITAL_COMMUNITY): Payer: Self-pay

## 2022-09-01 ENCOUNTER — Other Ambulatory Visit (HOSPITAL_COMMUNITY): Payer: Self-pay

## 2022-09-02 ENCOUNTER — Other Ambulatory Visit (HOSPITAL_COMMUNITY): Payer: Self-pay

## 2022-09-02 MED ORDER — VALACYCLOVIR HCL 500 MG PO TABS
500.0000 mg | ORAL_TABLET | Freq: Every day | ORAL | 3 refills | Status: DC
  Filled 2022-09-02: qty 90, 90d supply, fill #0
  Filled 2022-11-30 – 2022-12-06 (×2): qty 90, 90d supply, fill #1
  Filled 2023-02-22: qty 90, 90d supply, fill #2

## 2022-09-04 ENCOUNTER — Other Ambulatory Visit: Payer: Self-pay

## 2022-09-19 DIAGNOSIS — L720 Epidermal cyst: Secondary | ICD-10-CM | POA: Diagnosis not present

## 2022-09-19 DIAGNOSIS — L821 Other seborrheic keratosis: Secondary | ICD-10-CM | POA: Diagnosis not present

## 2022-09-19 DIAGNOSIS — C4441 Basal cell carcinoma of skin of scalp and neck: Secondary | ICD-10-CM | POA: Diagnosis not present

## 2022-09-19 DIAGNOSIS — B353 Tinea pedis: Secondary | ICD-10-CM | POA: Diagnosis not present

## 2022-09-19 DIAGNOSIS — D485 Neoplasm of uncertain behavior of skin: Secondary | ICD-10-CM | POA: Diagnosis not present

## 2022-09-19 DIAGNOSIS — D225 Melanocytic nevi of trunk: Secondary | ICD-10-CM | POA: Diagnosis not present

## 2022-09-19 DIAGNOSIS — D2272 Melanocytic nevi of left lower limb, including hip: Secondary | ICD-10-CM | POA: Diagnosis not present

## 2022-10-13 ENCOUNTER — Other Ambulatory Visit (HOSPITAL_COMMUNITY): Payer: Self-pay

## 2022-10-13 ENCOUNTER — Other Ambulatory Visit: Payer: Self-pay

## 2022-10-13 DIAGNOSIS — I10 Essential (primary) hypertension: Secondary | ICD-10-CM | POA: Diagnosis not present

## 2022-10-13 DIAGNOSIS — Z79899 Other long term (current) drug therapy: Secondary | ICD-10-CM | POA: Diagnosis not present

## 2022-10-13 DIAGNOSIS — E7800 Pure hypercholesterolemia, unspecified: Secondary | ICD-10-CM | POA: Diagnosis not present

## 2022-10-13 DIAGNOSIS — Z0000 Encounter for general adult medical examination without abnormal findings: Secondary | ICD-10-CM | POA: Diagnosis not present

## 2022-10-13 DIAGNOSIS — F419 Anxiety disorder, unspecified: Secondary | ICD-10-CM | POA: Diagnosis not present

## 2022-10-13 DIAGNOSIS — Z299 Encounter for prophylactic measures, unspecified: Secondary | ICD-10-CM | POA: Diagnosis not present

## 2022-10-13 DIAGNOSIS — Z125 Encounter for screening for malignant neoplasm of prostate: Secondary | ICD-10-CM | POA: Diagnosis not present

## 2022-10-13 DIAGNOSIS — Z1331 Encounter for screening for depression: Secondary | ICD-10-CM | POA: Diagnosis not present

## 2022-10-13 DIAGNOSIS — Z6830 Body mass index (BMI) 30.0-30.9, adult: Secondary | ICD-10-CM | POA: Diagnosis not present

## 2022-10-13 MED ORDER — SUMATRIPTAN SUCCINATE 50 MG PO TABS
50.0000 mg | ORAL_TABLET | Freq: Two times a day (BID) | ORAL | 4 refills | Status: AC | PRN
  Filled 2022-10-13: qty 45, 90d supply, fill #0

## 2022-10-13 MED ORDER — NORTRIPTYLINE HCL 10 MG PO CAPS
20.0000 mg | ORAL_CAPSULE | Freq: Every evening | ORAL | 4 refills | Status: DC
  Filled 2022-10-13 – 2022-12-06 (×3): qty 180, 90d supply, fill #0
  Filled 2023-02-22: qty 180, 90d supply, fill #1
  Filled 2023-07-20: qty 180, 90d supply, fill #2
  Filled 2023-10-13: qty 180, 90d supply, fill #3

## 2022-10-13 MED ORDER — NEBIVOLOL HCL 5 MG PO TABS
5.0000 mg | ORAL_TABLET | Freq: Every day | ORAL | 4 refills | Status: DC
  Filled 2022-10-13 – 2022-12-06 (×3): qty 90, 90d supply, fill #0

## 2022-10-13 MED ORDER — TAMSULOSIN HCL 0.4 MG PO CAPS
0.4000 mg | ORAL_CAPSULE | Freq: Every evening | ORAL | 2 refills | Status: DC
  Filled 2022-10-13: qty 30, 30d supply, fill #0

## 2022-10-13 MED ORDER — PANTOPRAZOLE SODIUM 40 MG PO TBEC
40.0000 mg | DELAYED_RELEASE_TABLET | Freq: Every day | ORAL | 3 refills | Status: DC
  Filled 2022-10-13: qty 90, 90d supply, fill #0

## 2022-10-13 MED ORDER — VALACYCLOVIR HCL 500 MG PO TABS
500.0000 mg | ORAL_TABLET | Freq: Every day | ORAL | 3 refills | Status: DC
  Filled 2022-10-13 – 2023-05-30 (×2): qty 90, 90d supply, fill #0
  Filled 2023-09-01: qty 90, 90d supply, fill #1

## 2022-11-30 ENCOUNTER — Other Ambulatory Visit: Payer: Self-pay

## 2022-11-30 ENCOUNTER — Other Ambulatory Visit (HOSPITAL_COMMUNITY): Payer: Self-pay

## 2022-12-04 DIAGNOSIS — B0051 Herpesviral iridocyclitis: Secondary | ICD-10-CM | POA: Diagnosis not present

## 2022-12-04 DIAGNOSIS — H2513 Age-related nuclear cataract, bilateral: Secondary | ICD-10-CM | POA: Diagnosis not present

## 2022-12-04 DIAGNOSIS — H179 Unspecified corneal scar and opacity: Secondary | ICD-10-CM | POA: Diagnosis not present

## 2022-12-05 ENCOUNTER — Other Ambulatory Visit: Payer: Self-pay

## 2022-12-06 ENCOUNTER — Other Ambulatory Visit: Payer: Self-pay

## 2022-12-06 ENCOUNTER — Other Ambulatory Visit (HOSPITAL_COMMUNITY): Payer: Self-pay

## 2022-12-18 ENCOUNTER — Other Ambulatory Visit (HOSPITAL_BASED_OUTPATIENT_CLINIC_OR_DEPARTMENT_OTHER): Payer: Self-pay

## 2022-12-28 ENCOUNTER — Encounter (INDEPENDENT_AMBULATORY_CARE_PROVIDER_SITE_OTHER): Payer: Self-pay | Admitting: *Deleted

## 2022-12-28 DIAGNOSIS — L820 Inflamed seborrheic keratosis: Secondary | ICD-10-CM | POA: Diagnosis not present

## 2022-12-28 DIAGNOSIS — B078 Other viral warts: Secondary | ICD-10-CM | POA: Diagnosis not present

## 2022-12-28 DIAGNOSIS — Z85828 Personal history of other malignant neoplasm of skin: Secondary | ICD-10-CM | POA: Diagnosis not present

## 2023-01-06 ENCOUNTER — Other Ambulatory Visit (HOSPITAL_COMMUNITY): Payer: Self-pay

## 2023-02-01 DIAGNOSIS — I10 Essential (primary) hypertension: Secondary | ICD-10-CM | POA: Diagnosis not present

## 2023-02-01 DIAGNOSIS — G43909 Migraine, unspecified, not intractable, without status migrainosus: Secondary | ICD-10-CM | POA: Diagnosis not present

## 2023-02-01 DIAGNOSIS — Z299 Encounter for prophylactic measures, unspecified: Secondary | ICD-10-CM | POA: Diagnosis not present

## 2023-02-19 DIAGNOSIS — R519 Headache, unspecified: Secondary | ICD-10-CM | POA: Diagnosis not present

## 2023-02-19 DIAGNOSIS — Z299 Encounter for prophylactic measures, unspecified: Secondary | ICD-10-CM | POA: Diagnosis not present

## 2023-02-19 DIAGNOSIS — I10 Essential (primary) hypertension: Secondary | ICD-10-CM | POA: Diagnosis not present

## 2023-02-20 ENCOUNTER — Other Ambulatory Visit (HOSPITAL_COMMUNITY): Payer: Self-pay

## 2023-02-21 ENCOUNTER — Other Ambulatory Visit (HOSPITAL_COMMUNITY): Payer: Self-pay

## 2023-02-21 MED ORDER — METOPROLOL SUCCINATE ER 50 MG PO TB24
75.0000 mg | ORAL_TABLET | Freq: Every day | ORAL | 3 refills | Status: DC
  Filled 2023-03-15: qty 45, 30d supply, fill #0

## 2023-02-22 ENCOUNTER — Other Ambulatory Visit (HOSPITAL_COMMUNITY): Payer: Self-pay

## 2023-02-23 ENCOUNTER — Ambulatory Visit (INDEPENDENT_AMBULATORY_CARE_PROVIDER_SITE_OTHER): Payer: Commercial Managed Care - PPO | Admitting: Otolaryngology

## 2023-02-27 ENCOUNTER — Other Ambulatory Visit (HOSPITAL_COMMUNITY): Payer: Self-pay

## 2023-03-01 ENCOUNTER — Other Ambulatory Visit (HOSPITAL_COMMUNITY): Payer: Self-pay

## 2023-03-02 ENCOUNTER — Other Ambulatory Visit (HOSPITAL_COMMUNITY): Payer: Self-pay

## 2023-03-02 MED ORDER — LISINOPRIL 10 MG PO TABS
10.0000 mg | ORAL_TABLET | Freq: Every day | ORAL | 3 refills | Status: DC
  Filled 2023-03-02 – 2023-03-15 (×2): qty 30, 30d supply, fill #0

## 2023-03-15 ENCOUNTER — Other Ambulatory Visit (HOSPITAL_COMMUNITY): Payer: Self-pay

## 2023-03-15 ENCOUNTER — Other Ambulatory Visit: Payer: Self-pay

## 2023-03-19 DIAGNOSIS — R42 Dizziness and giddiness: Secondary | ICD-10-CM | POA: Diagnosis not present

## 2023-03-19 DIAGNOSIS — I10 Essential (primary) hypertension: Secondary | ICD-10-CM | POA: Diagnosis not present

## 2023-03-19 DIAGNOSIS — Z299 Encounter for prophylactic measures, unspecified: Secondary | ICD-10-CM | POA: Diagnosis not present

## 2023-04-03 ENCOUNTER — Encounter: Payer: Self-pay | Admitting: *Deleted

## 2023-04-04 ENCOUNTER — Other Ambulatory Visit (HOSPITAL_COMMUNITY): Payer: Self-pay

## 2023-04-04 ENCOUNTER — Encounter: Payer: Self-pay | Admitting: Cardiology

## 2023-04-04 ENCOUNTER — Ambulatory Visit: Payer: Commercial Managed Care - PPO | Attending: Cardiology | Admitting: Cardiology

## 2023-04-04 ENCOUNTER — Other Ambulatory Visit: Payer: Self-pay

## 2023-04-04 VITALS — BP 154/90 | HR 72 | Ht 69.0 in | Wt 195.4 lb

## 2023-04-04 DIAGNOSIS — R0602 Shortness of breath: Secondary | ICD-10-CM

## 2023-04-04 DIAGNOSIS — I10 Essential (primary) hypertension: Secondary | ICD-10-CM | POA: Diagnosis not present

## 2023-04-04 MED ORDER — LISINOPRIL 20 MG PO TABS
20.0000 mg | ORAL_TABLET | Freq: Every day | ORAL | 1 refills | Status: DC
  Filled 2023-04-04: qty 90, 90d supply, fill #0

## 2023-04-04 MED ORDER — METOPROLOL SUCCINATE ER 25 MG PO TB24
25.0000 mg | ORAL_TABLET | Freq: Every day | ORAL | 2 refills | Status: DC
  Filled 2023-04-04: qty 30, 30d supply, fill #0
  Filled 2023-05-01: qty 30, 30d supply, fill #1

## 2023-04-04 NOTE — Progress Notes (Signed)
Clinical Summary Xavier Walsh is a 57 y.o.male seen as a new consult, referred by Dr Sherril Croon for the following medical problems.   1. HTN - compliant with meds - home bp's  130s-140s/80s - some fatigue after meds he reports - allergy to benicar - appears on beta blockers for secondary indication with migraine headaches. Had been on bystolic and done well, changed to toprol and feels like has not worked as well  2.Dizziness - tends to occur most often with standing - seems to have started when bisoprolol was changed to metorpolol   3. SOB - when walking notes some SOB - some SOB walking up inclines - reports some recent weight gain.       Past Medical History:  Diagnosis Date   GERD (gastroesophageal reflux disease)    Headache    migranes   Hypertension      Allergies  Allergen Reactions   Iodine-131 Anaphylaxis   Reglan [Metoclopramide] Other (See Comments)    Hyperactivity      Current Outpatient Medications  Medication Sig Dispense Refill   baclofen (LIORESAL) 10 MG tablet Take 1 tablet (10 mg total) by mouth 2 (two) times daily. 20 tablet 0   diazepam (VALIUM) 5 MG tablet Take 1 tab by mouth 1 hour prior to appointment. If an additional 1 is needed, it is ok to take 30 mins prior to appointment. 2 tablet 0   lisinopril (ZESTRIL) 10 MG tablet Take 1 tablet (10 mg total) by mouth daily. 30 tablet 3   metoprolol succinate (TOPROL-XL) 50 MG 24 hr tablet Take 1.5 tablets (75 mg total) by mouth daily. Stop nebivolol. 45 tablet 3   nebivolol (BYSTOLIC) 5 MG tablet Take 5 mg by mouth daily.     nebivolol (BYSTOLIC) 5 MG tablet Take 1 tablet (5 mg total) by mouth daily. 90 tablet 4   nortriptyline (PAMELOR) 10 MG capsule Take 20 mg by mouth at bedtime.     nortriptyline (PAMELOR) 10 MG capsule TAKE 2 CAPSULES BY MOUTH AT BEDTIME 180 capsule 4   nortriptyline (PAMELOR) 10 MG capsule Take 2 capsules (20 mg total) by mouth at bedtime. 180 capsule 4   pantoprazole  (PROTONIX) 40 MG tablet Take 40 mg by mouth daily.     pantoprazole (PROTONIX) 40 MG tablet Take 1 (one) Tablet by mouth one daily. 90 tablet 3   pantoprazole (PROTONIX) 40 MG tablet TAKE 1 TABLET BY MOUTH ONCE DAILY 90 tablet 3   pantoprazole (PROTONIX) 40 MG tablet Take 1 tablet by mouth daily 90 tablet 3   pantoprazole (PROTONIX) 40 MG tablet Take 1 tablet (40 mg total) by mouth daily. 90 tablet 3   prednisoLONE acetate (PRED FORTE) 1 % ophthalmic suspension Place 1 drop into both eyes 2 (two) times daily as needed (irritation to eye).     SUMAtriptan (IMITREX) 50 MG tablet Take 50 mg by mouth every 2 (two) hours as needed for migraine. May repeat in 2 hours if headache persists or recurs.     SUMAtriptan (IMITREX) 50 MG tablet Take 1 tablet by mouth 2 times a day as needed 45 tablet 4   SUMAtriptan (IMITREX) 50 MG tablet Take 1 tablet (50 mg total) by mouth 2 (two) times daily as needed. 45 tablet 4   tamsulosin (FLOMAX) 0.4 MG CAPS capsule Take 1 capsule (0.4 mg total) by mouth at bedtime. 30 capsule 2   valACYclovir (VALTREX) 500 MG tablet Take 500 mg by mouth daily.  valACYclovir (VALTREX) 500 MG tablet Take 1 tablet by mouth daily. 90 tablet 3   valACYclovir (VALTREX) 500 MG tablet Take 1 tablet (500 mg total) by mouth daily. 90 tablet 3   No current facility-administered medications for this visit.     Past Surgical History:  Procedure Laterality Date   BIOPSY  01/28/2018   Procedure: BIOPSY;  Surgeon: Malissa Hippo, MD;  Location: [facility];  Service: Endoscopy;;  gastric   COLONOSCOPY N/A 01/28/2018   Procedure: COLONOSCOPY;  Surgeon: Malissa Hippo, MD;  Location: [facility];  Service: Endoscopy;  Laterality: N/A;  730   ESOPHAGOGASTRODUODENOSCOPY N/A 01/28/2018   Procedure: ESOPHAGOGASTRODUODENOSCOPY (EGD);  Surgeon: Malissa Hippo, MD;  Location: [facility];  Service: Endoscopy;  Laterality: N/A;   EYE SURGERY     bilateral   KNEE CARTILAGE SURGERY Right     POLYPECTOMY  01/28/2018   Procedure: POLYPECTOMY;  Surgeon: Malissa Hippo, MD;  Location: [facility];  Service: Endoscopy;;  colon     Allergies  Allergen Reactions   Iodine-131 Anaphylaxis   Reglan [Metoclopramide] Other (See Comments)    Hyperactivity       Family History  Problem Relation Age of Onset   Panic disorder Mother    Diabetes Father    Hypertension Father    Hiatal hernia Father      Social History Xavier Walsh reports that he has never smoked. He has never used smokeless tobacco. Xavier Walsh reports no history of alcohol use.   Review of Systems CONSTITUTIONAL: No weight loss, fever, chills, weakness or fatigue.  HEENT: Eyes: No visual loss, blurred vision, double vision or yellow sclerae.No hearing loss, sneezing, congestion, runny nose or sore throat.  SKIN: No rash or itching.  CARDIOVASCULAR: per hpi RESPIRATORY: per hpi GASTROINTESTINAL: No anorexia, nausea, vomiting or diarrhea. No abdominal pain or blood.  GENITOURINARY: No burning on urination, no polyuria NEUROLOGICAL: No headache, dizziness, syncope, paralysis, ataxia, numbness or tingling in the extremities. No change in bowel or bladder control.  MUSCULOSKELETAL: No muscle, back pain, joint pain or stiffness.  LYMPHATICS: No enlarged nodes. No history of splenectomy.  PSYCHIATRIC: No history of depression or anxiety.  ENDOCRINOLOGIC: No reports of sweating, cold or heat intolerance. No polyuria or polydipsia.  Marland Kitchen   Physical Examination Today's Vitals   04/04/23 1450 04/04/23 1505  BP: (!) 160/90 (!) 154/90  Pulse: 72   SpO2: 95%   Weight: 195 lb 6.4 oz (88.6 kg)   Height: 5\' 9"  (1.753 m)    Body mass index is 28.86 kg/m.  Gen: resting comfortably, no acute distress HEENT: no scleral icterus, pupils equal round and reactive, no palptable cervical adenopathy,  CV: RRR, no m/r,g no jvd Resp: Clear to auscultation bilaterally GI: abdomen is soft, non-tender, non-distended, normal  bowel sounds, no hepatosplenomegaly MSK: extremities are warm, no edema.  Skin: warm, no rash Neuro:  no focal deficits Psych: appropriate affect      Assessment and Plan  1.HTN - above goal, increase lisinopril 20mg  daily - on toprol as well, secondary indication seems to be migraines. Reports some fatigue and dizziness since changing from bystolic, will try lowering metoprolol to 25mg  daily  2. DOE - some DOE with inclines, no chest pains - will check echo - at this time don't see strong indication for ischemic testing. Monitor symptoms over time, f/u echo - EKG today shows NSR  F/u 4-6 weeks.    Antoine Poche, M.D.

## 2023-04-04 NOTE — Patient Instructions (Signed)
Medication Instructions:  Your physician has recommended you make the following change in your medication:  Lisinopril has been increased to 20 mg daily Metoprolol has been decreased to 25 mg daily Continue taking all other medications as prescribed  Labwork: None  Testing/Procedures: Your physician has requested that you have an echocardiogram. Echocardiography is a painless test that uses sound waves to create images of your heart. It provides your doctor with information about the size and shape of your heart and how well your heart's chambers and valves are working. This procedure takes approximately one hour. There are no restrictions for this procedure. Please do NOT wear cologne, perfume, aftershave, or lotions (deodorant is allowed). Please arrive 15 minutes prior to your appointment time.  Please note: We ask at that you not bring children with you during ultrasound (echo/ vascular) testing. Due to room size and safety concerns, children are not allowed in the ultrasound rooms during exams. Our front office staff cannot provide observation of children in our lobby area while testing is being conducted. An adult accompanying a patient to their appointment will only be allowed in the ultrasound room at the discretion of the ultrasound technician under special circumstances. We apologize for any inconvenience.   Follow-Up: Your physician recommends that you schedule a follow-up appointment in: 4-6 weeks with Lanora Manis  Any Other Special Instructions Will Be Listed Below (If Applicable). Call or send [place] a message on MyChart with updated blood pressure readings  Thank you for choosing [facility]!      If you need a refill on your cardiac medications before your next appointment, please call your pharmacy.

## 2023-04-05 ENCOUNTER — Ambulatory Visit: Payer: Commercial Managed Care - PPO | Attending: Cardiology

## 2023-04-05 DIAGNOSIS — R0602 Shortness of breath: Secondary | ICD-10-CM | POA: Diagnosis not present

## 2023-04-09 LAB — ECHOCARDIOGRAM COMPLETE
AR max vel: 3.6 cm2
AV Area VTI: 3.24 cm2
AV Area mean vel: 3.35 cm2
AV Mean grad: 5 mm[Hg]
AV Peak grad: 9.1 mm[Hg]
Ao pk vel: 1.51 m/s
Area-P 1/2: 3.6 cm2
Calc EF: 63.3 %
MV VTI: 4.08 cm2
S' Lateral: 2.9 cm
Single Plane A2C EF: 64.4 %
Single Plane A4C EF: 60.9 %

## 2023-05-01 ENCOUNTER — Other Ambulatory Visit: Payer: Self-pay

## 2023-05-07 ENCOUNTER — Encounter: Payer: Self-pay | Admitting: Nurse Practitioner

## 2023-05-07 ENCOUNTER — Ambulatory Visit: Payer: Commercial Managed Care - PPO | Attending: Nurse Practitioner | Admitting: Nurse Practitioner

## 2023-05-07 ENCOUNTER — Other Ambulatory Visit: Payer: Self-pay

## 2023-05-07 VITALS — BP 138/86 | HR 65 | Ht 69.0 in | Wt 192.6 lb

## 2023-05-07 DIAGNOSIS — Z79899 Other long term (current) drug therapy: Secondary | ICD-10-CM | POA: Diagnosis not present

## 2023-05-07 DIAGNOSIS — E663 Overweight: Secondary | ICD-10-CM | POA: Diagnosis not present

## 2023-05-07 DIAGNOSIS — R42 Dizziness and giddiness: Secondary | ICD-10-CM

## 2023-05-07 DIAGNOSIS — I10 Essential (primary) hypertension: Secondary | ICD-10-CM

## 2023-05-07 MED ORDER — LISINOPRIL 40 MG PO TABS
40.0000 mg | ORAL_TABLET | Freq: Every day | ORAL | 1 refills | Status: DC
  Filled 2023-05-07: qty 90, 90d supply, fill #0

## 2023-05-07 MED ORDER — METOPROLOL SUCCINATE ER 25 MG PO TB24
12.5000 mg | ORAL_TABLET | Freq: Every day | ORAL | 1 refills | Status: DC
  Filled 2023-05-07 – 2023-06-13 (×2): qty 45, 90d supply, fill #0
  Filled 2023-09-07 – 2023-09-10 (×3): qty 45, 90d supply, fill #1

## 2023-05-07 NOTE — Patient Instructions (Addendum)
Medication Instructions:  Your physician has recommended you make the following change in your medication:  Please reduce Metoprolol to 12.5 Mg daily  Increase Lisinopril to 40 Mg daily   Labwork: In 2 weeks at [facility]   Testing/Procedures: None   Follow-Up: Your physician recommends that you schedule a follow-up appointment in:  Nurse visit for Blood pressure check in 2 weeks  2-3 months   Any Other Special Instructions Will Be Listed Below (If Applicable).  If you need a refill on your cardiac medications before your next appointment, please call your pharmacy.

## 2023-05-07 NOTE — Progress Notes (Signed)
Cardiology Office Note:  .   Date:  05/07/2023  ID:  Sharyn Lull, DOB 1966/02/21, MRN [id number] PCP: Ignatius Specking, MD  [facility] Providers Cardiologist:  Dina Rich, MD    History of Present Illness: .   TAICHI RAMIREZ is a 57 y.o. male with a PMH of hypertension, shortness of breath, and dizziness, who presents today for 4 to 6-week follow-up.  Last seen by Dr. Dina Rich on April 12, 2023.  Patient noted some shortness of breath with inclines, denied any chest pain.  BP was above goal in office, lisinopril was increased to 20 mg daily, and he also reported some fatigue and dizziness since changing from Bystolic, metoprolol was lowered to 25 mg daily.  Echocardiogram was benign.  Today he presents for 4-week follow-up.  He states he notes intermittent dizziness noticed with position changes in the early morning. Provides his BP log from home, with average SBP of 130's  - 140's. Taking Toprol XL in AM and lisinopril is taken in the PM. Denies any chest pain, shortness of breath, palpitations, syncope, presyncope, orthopnea, PND, swelling or significant weight changes, acute bleeding, or claudication.  ROS: Negative. See HPI.   Studies Reviewed: .    Echo 03/2023:  1. Left ventricular ejection fraction, by estimation, is 70 to 75%. The  left ventricle has hyperdynamic function. The left ventricle has no  regional wall motion abnormalities. There is mild left ventricular  hypertrophy. Left ventricular diastolic  parameters were normal.   2. Right ventricular systolic function is normal. The right ventricular  size is normal.   3. The mitral valve is normal in structure. No evidence of mitral valve  regurgitation. No evidence of mitral stenosis.   4. The tricuspid valve is abnormal.   5. The aortic valve has an indeterminant number of cusps. Aortic valve  regurgitation is mild. No aortic stenosis is present.   6. Aortic dilatation noted. There is mild  dilatation of the aortic root,  measuring 40 mm. There is mild dilatation of the ascending aorta,  measuring 40 mm.   7. IVC is small suggesting low RA pressure and hypovolemia.   Comparison(s): No prior study.     Physical Exam:   VS:  BP 138/86   Pulse 65   Ht 5\' 9"  (1.753 m)   Wt 192 lb 9.6 oz (87.4 kg)   SpO2 96%   BMI 28.44 kg/m    Wt Readings from Last 3 Encounters:  05/07/23 192 lb 9.6 oz (87.4 kg)  04/04/23 195 lb 6.4 oz (88.6 kg)  01/28/18 220 lb (99.8 kg)    Orthostatic vital signs: Lying: 151/93, 67 bpm Sitting: 145 or 91, 76 bpm Standing: 160/104, 69 bpm Standing x 3 minutes: 157 103, 71 bpm  GEN: Well nourished, well developed in no acute distress NECK: No JVD; No carotid bruits CARDIAC: S1/S2, RRR, no murmurs, rubs, gallops RESPIRATORY:  Clear to auscultation without rales, wheezing or rhonchi  ABDOMEN: Soft, non-tender, non-distended EXTREMITIES:  No edema; No deformity   ASSESSMENT AND PLAN: .    HTN SBP at home averaging 130s to 140s.  BP today 138/86.  SBP goal is less than 130.  However, has noticed some orthostatic dizziness recently.  Did well on Bystolic previously, has reported allergy to Benicar. Has been on beta-blockers for secondary indication with migraine headaches.  Will lower Toprol-XL to 12.5 mg daily and increase lisinopril to 40 mg daily, and will obtain BMET in 1 week.  Will bring back for RN visit for BP check in 1 to 2 weeks. Discussed to monitor BP at home at least 2 hours after medications and sitting for 5-10 minutes.  Recommended to follow-up with PCP regarding his medication management.  He verbalized understanding. Heart healthy diet and regular cardiovascular exercise encouraged.   Orthostatic dizziness Rare in occurrence, occurs when waking up in the morning.  Discussed conservative measures.  Orthostatics negative today.  Previous echocardiogram benign.  Medication adjustments as noted above.  Care and ED precautions discussed.   Continue follow-up with PCP.  Recommended compression stockings.  He verbalized understanding.    3. Overweight Weight loss via diet and exercise encouraged. Discussed the impact being overweight would have on cardiovascular risk.   Dispo: Follow-up with me/APP in 2 to 3 months or sooner if anything changes.  Signed, Sharlene Dory, NP

## 2023-05-08 ENCOUNTER — Other Ambulatory Visit: Payer: Self-pay

## 2023-05-08 ENCOUNTER — Other Ambulatory Visit (HOSPITAL_COMMUNITY): Payer: Self-pay

## 2023-05-21 ENCOUNTER — Other Ambulatory Visit (HOSPITAL_COMMUNITY)
Admission: RE | Admit: 2023-05-21 | Discharge: 2023-05-21 | Disposition: A | Discharge status: Home / Self Care | Payer: Commercial Managed Care - PPO | Source: Ambulatory Visit | Attending: Nurse Practitioner | Admitting: Nurse Practitioner

## 2023-05-21 DIAGNOSIS — Z79899 Other long term (current) drug therapy: Secondary | ICD-10-CM | POA: Insufficient documentation

## 2023-05-21 DIAGNOSIS — I10 Essential (primary) hypertension: Secondary | ICD-10-CM | POA: Diagnosis not present

## 2023-05-21 LAB — BASIC METABOLIC PANEL
Anion gap: 7 (ref 5–15)
BUN: 26 mg/dL — ABNORMAL HIGH (ref 6–20)
CO2: 23 mmol/L (ref 22–32)
Calcium: 9 mg/dL (ref 8.9–10.3)
Chloride: 105 mmol/L (ref 98–111)
Creatinine, Ser: 0.86 mg/dL (ref 0.61–1.24)
GFR, Estimated: >60 mL/min (ref >60)
Glucose, Bld: 102 mg/dL — ABNORMAL HIGH (ref 70–99)
Potassium: 3.7 mmol/L (ref 3.5–5.1)
Sodium: 135 mmol/L (ref 135–145)

## 2023-05-23 ENCOUNTER — Other Ambulatory Visit: Payer: Self-pay

## 2023-05-23 ENCOUNTER — Ambulatory Visit: Payer: Commercial Managed Care - PPO | Attending: Cardiology

## 2023-05-23 ENCOUNTER — Telehealth: Payer: Self-pay

## 2023-05-23 ENCOUNTER — Other Ambulatory Visit (HOSPITAL_COMMUNITY): Payer: Self-pay

## 2023-05-23 VITALS — BP 140/82 | HR 85

## 2023-05-23 DIAGNOSIS — I10 Essential (primary) hypertension: Secondary | ICD-10-CM | POA: Diagnosis not present

## 2023-05-23 MED ORDER — LISINOPRIL 20 MG PO TABS
20.0000 mg | ORAL_TABLET | Freq: Every day | ORAL | 3 refills | Status: DC
  Filled 2023-05-23 – 2023-06-19 (×3): qty 30, 30d supply, fill #0
  Filled 2023-07-20: qty 30, 30d supply, fill #1
  Filled 2023-08-13 – 2023-08-14 (×2): qty 30, 30d supply, fill #2
  Filled 2023-09-10: qty 30, 30d supply, fill #3

## 2023-05-23 MED ORDER — AMLODIPINE BESYLATE 2.5 MG PO TABS
2.5000 mg | ORAL_TABLET | Freq: Every day | ORAL | 3 refills | Status: DC
  Filled 2023-05-23: qty 30, 30d supply, fill #0
  Filled 2023-06-18: qty 30, 30d supply, fill #1

## 2023-05-23 NOTE — Telephone Encounter (Signed)
 Home BP log reviewed and chart reviewed.  SBP is not quite at goal.  Home log reveals SBP averaging low 130s to low 140s.  His SBP goal is below 130.  At last office visit we checked orthostatic vital signs and these were negative.   I recommend decreasing lisinopril  to 20 mg daily to see how he does on this new dosage to help improve his stomach issues.  He needs to follow-up with his PCP regarding this.  Please start Amlodipine  2.5 mg at night and let's continue to monitor his BP and see how he does.    Continue to monitor and log his BP at home and bring this to next follow-up visit with me.  Follow-up as scheduled.   Will route note to cardiologist, Dr. Alvan, so he is aware.    Xavier Crate, NP  Patient informed and verbalized understanding. Medication changes sent in

## 2023-05-23 NOTE — Progress Notes (Signed)
 Patient here for BP check. Reported no Chest Pain or SOB. He stated that he has been having severe stomach pain since the increase in the lisinopril . Dizziness has gotten better since the change with the Metoprolol . He does wake up with a slight headache in the mornings, but goes away. Really couldn't remember if he took his Metoprolol  this morning before coming in, takes his lisinopril  at night. Also wanted to mention that he has been experiencing nerve pain in the middle of the night and has been going on for about a week or so. No other concerns mentioned and gave BP log to [facility].

## 2023-05-23 NOTE — Progress Notes (Signed)
 Home BP log reviewed and chart reviewed.  SBP is not quite at goal.  Home log reveals SBP averaging low 130s to low 140s.  His SBP goal is below 130.  At last office visit we checked orthostatic vital signs and these were negative.  I recommend decreasing lisinopril  to 20 mg daily to see how he does on this new dosage to help improve his stomach issues.  He needs to follow-up with his PCP regarding this.  Please start Amlodipine  2.5 mg at night and let's continue to monitor his BP and see how he does.   Continue to monitor and log his BP at home and bring this to next follow-up visit with me.  Follow-up as scheduled.  Will route note to cardiologist, Dr. Alvan, so he is aware.   Xavier Crate, NP

## 2023-05-23 NOTE — Telephone Encounter (Signed)
-----   Message from Sharlene Dory sent at 05/23/2023  9:21 AM EST -----  ----- Message ----- From: Carmelina Paddock, CMA Sent: 05/23/2023   9:08 AM EST To: Sharlene Dory, NP

## 2023-05-24 ENCOUNTER — Other Ambulatory Visit (HOSPITAL_COMMUNITY): Payer: Self-pay

## 2023-05-30 ENCOUNTER — Other Ambulatory Visit (HOSPITAL_COMMUNITY): Payer: Self-pay

## 2023-05-30 ENCOUNTER — Other Ambulatory Visit: Payer: Self-pay

## 2023-06-14 ENCOUNTER — Other Ambulatory Visit: Payer: Self-pay

## 2023-06-14 ENCOUNTER — Other Ambulatory Visit (HOSPITAL_COMMUNITY): Payer: Self-pay

## 2023-06-18 ENCOUNTER — Other Ambulatory Visit (HOSPITAL_COMMUNITY): Payer: Self-pay

## 2023-06-18 ENCOUNTER — Other Ambulatory Visit: Payer: Self-pay | Admitting: Nurse Practitioner

## 2023-06-19 ENCOUNTER — Other Ambulatory Visit (HOSPITAL_COMMUNITY): Payer: Self-pay

## 2023-07-16 ENCOUNTER — Other Ambulatory Visit: Payer: Self-pay

## 2023-07-16 ENCOUNTER — Ambulatory Visit: Payer: Commercial Managed Care - PPO | Attending: Nurse Practitioner | Admitting: Nurse Practitioner

## 2023-07-16 ENCOUNTER — Other Ambulatory Visit (HOSPITAL_COMMUNITY): Payer: Self-pay

## 2023-07-16 ENCOUNTER — Encounter: Payer: Self-pay | Admitting: Nurse Practitioner

## 2023-07-16 VITALS — BP 145/80 | HR 68 | Ht 69.0 in | Wt 187.0 lb

## 2023-07-16 DIAGNOSIS — Z131 Encounter for screening for diabetes mellitus: Secondary | ICD-10-CM

## 2023-07-16 DIAGNOSIS — I10 Essential (primary) hypertension: Secondary | ICD-10-CM

## 2023-07-16 DIAGNOSIS — M792 Neuralgia and neuritis, unspecified: Secondary | ICD-10-CM

## 2023-07-16 DIAGNOSIS — R109 Unspecified abdominal pain: Secondary | ICD-10-CM | POA: Diagnosis not present

## 2023-07-16 DIAGNOSIS — E663 Overweight: Secondary | ICD-10-CM | POA: Diagnosis not present

## 2023-07-16 MED ORDER — AMLODIPINE BESYLATE 5 MG PO TABS
5.0000 mg | ORAL_TABLET | Freq: Every day | ORAL | 1 refills | Status: DC
  Filled 2023-07-16: qty 90, 90d supply, fill #0
  Filled 2023-10-09: qty 90, 90d supply, fill #1

## 2023-07-16 NOTE — Patient Instructions (Addendum)
 Medication Instructions:  Your physician has recommended you make the following change in your medication:  Please Increase Amlodipine to 5 Mg daily   Labwork: In about a week   Testing/Procedures: None   Follow-Up: Your physician recommends that you schedule a follow-up appointment in: 6 months    Any Other Special Instructions Will Be Listed Below (If Applicable).  If you need a refill on your cardiac medications before your next appointment, please call your pharmacy.

## 2023-07-16 NOTE — Progress Notes (Signed)
 Cardiology Office Note:  .   Date:  07/16/2023  ID:  Sharyn Lull, DOB April 13, 1966, MRN [id number] PCP: Ignatius Specking, MD  [facility] Providers Cardiologist:  Dina Rich, MD    History of Present Illness: .   Xavier Walsh is a 58 y.o. male with a PMH of hypertension, shortness of breath, and dizziness, who presents today for follow-up.  Last seen by Dr. Dina Rich on April 12, 2023.  Patient noted some shortness of breath with inclines, denied any chest pain.  BP was above goal in office, lisinopril was increased to 20 mg daily, and he also reported some fatigue and dizziness since changing from Bystolic, metoprolol was lowered to 25 mg daily.  Echocardiogram was benign.  05/07/2023 - Today he presents for 4-week follow-up.  He states he notes intermittent dizziness noticed with position changes in the early morning. Provides his BP log from home, with average SBP of 130's  - 140's. Taking Toprol XL in AM and lisinopril is taken in the PM. Denies any chest pain, shortness of breath, palpitations, syncope, presyncope, orthopnea, PND, swelling or significant weight changes, acute bleeding, or claudication.  07/16/2023 - Presents today for follow-up. Brings in his BP cuff from home with half of his readings at goal. Does admit to some nervousness, BP tends to run higher in office than at home. Does admit to nerve pain in his feet noted at night, has been ongoing for the past few weeks. Describes this as burning/itching sensation. Does admit to possible acid reflex/epigastric pain and right upper quadrant pain, says he can feel his pulse. Denies any chest pain, shortness of breath, syncope, presyncope, dizziness, orthopnea, PND, swelling or significant weight changes, acute bleeding, or claudication.  ROS: Negative. See HPI.  FH: positive for T2DM, CVA, pulmonary fibrosis, and lung cancer.   Studies Reviewed: Marland Kitchen    EKG: EKG is not ordered today.   Echo 03/2023:  1. Left  ventricular ejection fraction, by estimation, is 70 to 75%. The  left ventricle has hyperdynamic function. The left ventricle has no  regional wall motion abnormalities. There is mild left ventricular  hypertrophy. Left ventricular diastolic  parameters were normal.   2. Right ventricular systolic function is normal. The right ventricular  size is normal.   3. The mitral valve is normal in structure. No evidence of mitral valve  regurgitation. No evidence of mitral stenosis.   4. The tricuspid valve is abnormal.   5. The aortic valve has an indeterminant number of cusps. Aortic valve  regurgitation is mild. No aortic stenosis is present.   6. Aortic dilatation noted. There is mild dilatation of the aortic root,  measuring 40 mm. There is mild dilatation of the ascending aorta,  measuring 40 mm.   7. IVC is small suggesting low RA pressure and hypovolemia.   Comparison(s): No prior study.     Physical Exam:   VS:  BP (!) 145/80 (BP Location: Right Arm, Patient Position: Sitting, Cuff Size: Normal)   Pulse 68   Ht 5\' 9"  (1.753 m)   Wt 187 lb (84.8 kg)   SpO2 97%   BMI 27.62 kg/m    Wt Readings from Last 3 Encounters:  07/16/23 187 lb (84.8 kg)  05/07/23 192 lb 9.6 oz (87.4 kg)  04/04/23 195 lb 6.4 oz (88.6 kg)     GEN: Well nourished, well developed in no acute distress NECK: No JVD; No carotid bruits CARDIAC: S1/S2, RRR, no murmurs, rubs,  gallops RESPIRATORY:  Clear to auscultation without rales, wheezing or rhonchi  ABDOMEN: Soft, non-tender, non-distended EXTREMITIES:  No edema; No deformity   ASSESSMENT AND PLAN: .    HTN SBP at home averaging 130s to 140s. SBP goal is less than 130.  Will increase Norvasc to 5 mg daily. Continue rest of medication regimen. Discussed to monitor BP at home at least 2 hours after medications and sitting for 5-10 minutes.  Recommended to follow-up with PCP regarding his medication management.  He verbalized understanding. Heart healthy diet  and regular cardiovascular exercise encouraged.   2. Overweight Weight loss via diet and exercise encouraged. Discussed the impact being overweight would have on cardiovascular risk.  3. Screening for diabetes, nerve pain, abdominal pain Has never been screened for diabetes. Positive FH of T2DM and CVA. Will obtain A1C. Etiology unclear regarding nerve pain and abdominal pain. No evidence of PAD on exam. Recommended to f/u with PCP regarding this.     Dispo: Follow-up with Dr. Dina Rich or APP in 6 months or sooner if anything changes.  Signed, Sharlene Dory, NP

## 2023-07-18 DIAGNOSIS — Z131 Encounter for screening for diabetes mellitus: Secondary | ICD-10-CM | POA: Diagnosis not present

## 2023-07-20 ENCOUNTER — Other Ambulatory Visit (HOSPITAL_COMMUNITY): Payer: Self-pay

## 2023-07-24 ENCOUNTER — Other Ambulatory Visit (HOSPITAL_COMMUNITY): Payer: Self-pay

## 2023-08-13 ENCOUNTER — Other Ambulatory Visit (HOSPITAL_COMMUNITY): Payer: Self-pay

## 2023-08-14 ENCOUNTER — Encounter (INDEPENDENT_AMBULATORY_CARE_PROVIDER_SITE_OTHER): Payer: Self-pay | Admitting: Gastroenterology

## 2023-08-14 ENCOUNTER — Encounter (INDEPENDENT_AMBULATORY_CARE_PROVIDER_SITE_OTHER): Payer: Self-pay

## 2023-08-14 ENCOUNTER — Other Ambulatory Visit (HOSPITAL_COMMUNITY): Payer: Self-pay

## 2023-08-14 ENCOUNTER — Ambulatory Visit (INDEPENDENT_AMBULATORY_CARE_PROVIDER_SITE_OTHER): Admitting: Gastroenterology

## 2023-08-14 VITALS — BP 139/87 | HR 84 | Temp 97.1°F | Ht 69.0 in | Wt 187.1 lb

## 2023-08-14 DIAGNOSIS — R1310 Dysphagia, unspecified: Secondary | ICD-10-CM

## 2023-08-14 DIAGNOSIS — K219 Gastro-esophageal reflux disease without esophagitis: Secondary | ICD-10-CM

## 2023-08-14 DIAGNOSIS — R1084 Generalized abdominal pain: Secondary | ICD-10-CM | POA: Diagnosis not present

## 2023-08-14 DIAGNOSIS — Z860100 Personal history of colon polyps, unspecified: Secondary | ICD-10-CM | POA: Insufficient documentation

## 2023-08-14 DIAGNOSIS — K5900 Constipation, unspecified: Secondary | ICD-10-CM | POA: Diagnosis not present

## 2023-08-14 DIAGNOSIS — K5904 Chronic idiopathic constipation: Secondary | ICD-10-CM | POA: Insufficient documentation

## 2023-08-14 DIAGNOSIS — Z860101 Personal history of adenomatous and serrated colon polyps: Secondary | ICD-10-CM

## 2023-08-14 MED ORDER — PSYLLIUM 58.6 % PO PACK
1.0000 | PACK | Freq: Two times a day (BID) | ORAL | 2 refills | Status: AC
  Filled 2023-08-14: qty 60, 30d supply, fill #0

## 2023-08-14 NOTE — Progress Notes (Signed)
 Vista Lawman , M.D. Gastroenterology & Hepatology [facility] [address] [place], [place] [address] Primary Care Physician: Ignatius Specking, MD [address] [place] [address]  Chief Complaint:  Intermittent dysphagia , Abdominal discomfort GERD and Surveillance colonoscopy   History of Present Illness: Xavier Walsh is a 58 y.o. male with HTN , chronic GERD who presents for evaluation of Intermittent dysphagia , Abdominal discomfort GERD and Surveillance colonoscopy   Patient reports that for past few months he is having upper abdominal discomfort which he describes as hunger pain.  Unable to relate any aggravating or relieving factors.  Patient reports intermittent constipation but has significantly improved with diet changes.  Patient has lost 20 pounds since last year as he is on modified keto diet.  Since then his GERD symptoms have improved but continues to have intermittent dysphagia.The patient denies having any nausea, vomiting, fever, chills, hematochezia, melena, hematemesis, abdominal distention, diarrhea, jaundice, pruritus or weight loss.  Last EGD:2019  - Normal esophagus. - Benign- appearing esophageal stenosis. Dilated. - Multiple gastric polyps. Biopsied. - Normal duodenal bulb and second portion of the duodenum.  Last Colonoscopy:2019  - Diverticulosis in the sigmoid colon. - Two small polyps in the rectum and in the sigmoid colon, removed with a cold snare. Resected and retrieved. - Internal hemorrhoids.  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 Both colonic polyps are tubular adenomas.  Next colonoscopy in 5 years.  FHx: neg for any gastrointestinal/liver disease, no malignancies Social: neg smoking, alcohol or illicit drug use Surgical: no abdominal surgeries  Past Medical History: Past Medical History:  Diagnosis Date   GERD (gastroesophageal reflux disease)    Headache    migranes    Hypertension     Past Surgical History: Past Surgical History:  Procedure Laterality Date   BIOPSY  01/28/2018   Procedure: BIOPSY;  Surgeon: Malissa Hippo, MD;  [facility];  Service: Endoscopy;;  gastric   COLONOSCOPY N/A 01/28/2018   Procedure: COLONOSCOPY;  Surgeon: Malissa Hippo, MD;  [facility];  Service: Endoscopy;  Laterality: N/A;  730   ESOPHAGOGASTRODUODENOSCOPY N/A 01/28/2018   Procedure: ESOPHAGOGASTRODUODENOSCOPY (EGD);  Surgeon: Malissa Hippo, MD;  [facility]: [facility];  Service: Endoscopy;  Laterality: N/A;   EYE SURGERY     bilateral   KNEE CARTILAGE SURGERY Right    POLYPECTOMY  01/28/2018   Procedure: POLYPECTOMY;  Surgeon: Malissa Hippo, MD;  [facility];  Service: Endoscopy;;  colon    Family History: Family History  Problem Relation Age of Onset   Panic disorder Mother    Diabetes Father    Hypertension Father    Hiatal hernia Father     Social History: Social History   Tobacco Use  Smoking Status Never  Smokeless Tobacco Never   Social History   Substance and Sexual Activity  Alcohol Use Never   Social History   Substance and Sexual Activity  Drug Use Never    Allergies: Allergies  Allergen Reactions   Iodine-131 Anaphylaxis   Reglan [Metoclopramide] Other (See Comments)    Hyperactivity     Medications: Current Outpatient Medications  Medication Sig Dispense Refill   amLODipine (NORVASC) 5 MG tablet Take 1 tablet (5 mg total) by mouth daily. 90 tablet 1   lisinopril (ZESTRIL) 20 MG tablet Take 1 tablet (20 mg total) by mouth daily. 30 tablet 3   metoprolol succinate (TOPROL  XL) 25 MG 24 hr tablet Take 0.5 tablets (12.5 mg total) by mouth daily. 45 tablet 1   nortriptyline (PAMELOR) 10 MG capsule Take 2 capsules (20 mg total) by mouth at bedtime. 180 capsule 4   omeprazole (PRILOSEC) 20 MG capsule Take 20 mg by mouth daily.     OVER THE COUNTER MEDICATION Multivitamin daily  Fish  oil daily     prednisoLONE acetate (PRED FORTE) 1 % ophthalmic suspension Place 1 drop into both eyes 2 (two) times daily as needed (irritation to eye).     SUMAtriptan (IMITREX) 50 MG tablet Take 1 tablet (50 mg total) by mouth 2 (two) times daily as needed. 45 tablet 4   valACYclovir (VALTREX) 500 MG tablet Take 1 tablet (500 mg total) by mouth daily. 90 tablet 3   No current facility-administered medications for this visit.    Review of Systems: GENERAL: negative for malaise, night sweats HEENT: No changes in hearing or vision, no nose bleeds or other nasal problems. NECK: Negative for lumps, goiter, pain and significant neck swelling RESPIRATORY: Negative for cough, wheezing CARDIOVASCULAR: Negative for chest pain, leg swelling, palpitations, orthopnea GI: SEE HPI MUSCULOSKELETAL: Negative for joint pain or swelling, back pain, and muscle pain. SKIN: Negative for lesions, rash HEMATOLOGY Negative for prolonged bleeding, bruising easily, and swollen nodes. ENDOCRINE: Negative for cold or heat intolerance, polyuria, polydipsia and goiter. NEURO: negative for tremor, gait imbalance, syncope and seizures. The remainder of the review of systems is noncontributory.   Physical Exam: BP 139/87 (BP Location: Left Arm, Patient Position: Sitting, Cuff Size: Normal)   Pulse 84   Temp (!) 97.1 F (36.2 C) (Temporal)   Ht 5\' 9"  (1.753 m)   Wt 187 lb 1.6 oz (84.9 kg)   BMI 27.63 kg/m  GENERAL: The patient is AO x3, in no acute distress. HEENT: Head is normocephalic and atraumatic. EOMI are intact. Mouth is well hydrated and without lesions. NECK: Supple. No masses LUNGS: Clear to auscultation. No presence of rhonchi/wheezing/rales. Adequate chest expansion HEART: RRR, normal s1 and s2. ABDOMEN: Soft, nontender, no guarding, no peritoneal signs, and nondistended. BS +. No masses.  Imaging/Labs: as above      No data to display         No results found for: "IRON", "TIBC",  "FERRITIN"  I personally reviewed and interpreted the available labs, imaging and endoscopic files.  Impression and Plan: Xavier Walsh is a 58 y.o. male with HTN , chronic GERD who presents for evaluation of Intermittent dysphagia , Abdominal discomfort GERD and Surveillance colonoscopy   #Intermittent dysphagia #Chronic GERD  Patient had upper endoscopy in 2019 with dilation 56 [address] Maloney where benign intrinsic lower esophagus stricture stretch.  Since then his symptoms have improved but continues to have intermittent dysphagia.  May need repeat dilation plus esophageal biopsies to rule out EOE as well.  RECS:  Will plan for EGD +/- esophageal biopsies and dilation Patient is maintained on lowest dose omeprazole 20 mg which is adequate 1) Avoid coffee, tea, cola beverages, carbonated beverages, spicy foods, greasy foods, foods high in acid content (e.g. tomatoes and citrus fruits), chocolate, and peppermint 2) Avoid drinking alcoholic beverages 3) Avoid smoking 4) Eat small meals and keep weight within normal range 5) Avoid recumbent posture for 3 hours post-prandially 6) Elevate head of bed  #Abdominal pain   Patient has upper abdominal manage generalized abdominal pain without any aggravating relieving factor.  This could be due to underlying intermittent constipation  but need to evaluate other organs  Recs:  Will obtain complete abdominal ultrasound IBgard 1-2 times daily Linzess samples given ( ) , if it helps may prescribe the patient in future Metamucil daily  #Surveillance colonoscopy   Patient had 2 small tubular adenoma removed 2019 suggest repeat 5 years as he is due for surveillance colonoscopy which we will schedule today  All questions were answered.      Vista Lawman, MD Gastroenterology and Hepatology [facility]   This chart has been completed using [facility] Dictation software, and while attempts have  been made to ensure accuracy , certain words and phrases may not be transcribed as intended

## 2023-08-14 NOTE — Patient Instructions (Signed)
 It was very nice to meet you today, as dicussed with will plan for the following :  1) Ensure adequate fluid intake: Aim for 8 glasses of water daily. Follow a high fiber diet: Include foods such as dates, prunes, pears, and kiwi. Use Metamucil daily. Linzess as needed and if it helps you call me I will prescribe it  2) IB guard  3) Complete abdominal ultrasound  4) Upper endoscopy and Colonoscopy

## 2023-08-15 ENCOUNTER — Other Ambulatory Visit (HOSPITAL_COMMUNITY): Payer: Self-pay

## 2023-08-23 ENCOUNTER — Ambulatory Visit (HOSPITAL_COMMUNITY)
Admission: RE | Admit: 2023-08-23 | Discharge: 2023-08-23 | Disposition: A | Discharge status: Home / Self Care | Source: Ambulatory Visit | Attending: Gastroenterology | Admitting: Gastroenterology

## 2023-08-23 DIAGNOSIS — R1084 Generalized abdominal pain: Secondary | ICD-10-CM | POA: Diagnosis not present

## 2023-08-27 ENCOUNTER — Telehealth (INDEPENDENT_AMBULATORY_CARE_PROVIDER_SITE_OTHER): Payer: Self-pay | Admitting: Gastroenterology

## 2023-08-27 NOTE — Telephone Encounter (Signed)
 Left message for pt to see if he was available on 09/06/23 for TCS/EGD with Dr.Ahmed (any room).

## 2023-08-27 NOTE — Telephone Encounter (Signed)
 Pt wife returned call and pt wife states pt works with someone in [place] and has already planned on taking 5/15 off.

## 2023-08-29 ENCOUNTER — Telehealth (INDEPENDENT_AMBULATORY_CARE_PROVIDER_SITE_OTHER): Payer: Self-pay | Admitting: *Deleted

## 2023-08-29 NOTE — Telephone Encounter (Signed)
 Referral sent, they will contact patient with apt

## 2023-08-29 NOTE — Telephone Encounter (Signed)
-----   Message from Hargis Lias sent at 08/28/2023  9:38 AM EDT ----- Regarding: Xavier Walsh  Can you please refer this patient to [facility].  Diagnosis : AAA ( abdominal aorta aneurysm)

## 2023-09-03 ENCOUNTER — Other Ambulatory Visit (HOSPITAL_COMMUNITY): Payer: Self-pay

## 2023-09-05 ENCOUNTER — Ambulatory Visit: Admitting: Vascular Surgery

## 2023-09-05 ENCOUNTER — Encounter: Payer: Self-pay | Admitting: Vascular Surgery

## 2023-09-05 VITALS — BP 169/102 | HR 79 | Temp 98.0°F | Ht 69.0 in | Wt 185.0 lb

## 2023-09-05 DIAGNOSIS — I714 Abdominal aortic aneurysm, without rupture, unspecified: Secondary | ICD-10-CM

## 2023-09-05 NOTE — Progress Notes (Signed)
 Patient ID: Xavier Walsh, male   DOB: 11-Feb-1966, 58 y.o.   MRN: [id number]  Reason for Consult: New Patient (Initial Visit)   Referred by Hargis Lias, MD  Subjective:     HPI:  Xavier Walsh is a 58 y.o. male without vascular history does have hypertension.  Recently he has been working out and has started on weight loss and states that he blood sugars been very well-controlled with A1c in the range of 5.  He was having abdominal pain which is intermittent and nature and underwent ultrasound was demonstrated a small abdominal aortic aneurysm.  He does have a male first cousin with an aneurysm but no other known personal or family history.  He does not currently have any abdominal pain and no new back pain.  He remains very active doing high intensity interval training and also walking frequently and controlling his diet with keto.  Past Medical History:  Diagnosis Date   GERD (gastroesophageal reflux disease)    Headache    migranes   Hypertension    Family History  Problem Relation Age of Onset   Panic disorder Mother    Diabetes Father    Hypertension Father    Hiatal hernia Father    Past Surgical History:  Procedure Laterality Date   BIOPSY  01/28/2018   Procedure: BIOPSY;  Surgeon: Ruby Corporal, MD;  [facility];  Service: Endoscopy;;  gastric   COLONOSCOPY N/A 01/28/2018   Procedure: COLONOSCOPY;  Surgeon: Ruby Corporal, MD;  [facility]: [facility];  Service: Endoscopy;  Laterality: N/A;  730   ESOPHAGOGASTRODUODENOSCOPY N/A 01/28/2018   Procedure: ESOPHAGOGASTRODUODENOSCOPY (EGD);  Surgeon: Ruby Corporal, MD;  Location: [facility];  Service: Endoscopy;  Laterality: N/A;   EYE SURGERY     bilateral   KNEE CARTILAGE SURGERY Right    POLYPECTOMY  01/28/2018   Procedure: POLYPECTOMY;  Surgeon: Ruby Corporal, MD;  Location: [facility];  Service: Endoscopy;;  colon    Short Social History:  Social History   Tobacco Use   Smoking  status: Never   Smokeless tobacco: Never  Substance Use Topics   Alcohol use: Never    Allergies  Allergen Reactions   Iodine-131 Anaphylaxis   Reglan [Metoclopramide] Other (See Comments)    Hyperactivity     Current Outpatient Medications  Medication Sig Dispense Refill   amLODipine  (NORVASC ) 5 MG tablet Take 1 tablet (5 mg total) by mouth daily. 90 tablet 1   lisinopril  (ZESTRIL ) 20 MG tablet Take 1 tablet (20 mg total) by mouth daily. 30 tablet 3   metoprolol  succinate (TOPROL  XL) 25 MG 24 hr tablet Take 0.5 tablets (12.5 mg total) by mouth daily. 45 tablet 1   nortriptyline  (PAMELOR ) 10 MG capsule Take 2 capsules (20 mg total) by mouth at bedtime. 180 capsule 4   omeprazole (PRILOSEC) 20 MG capsule Take 20 mg by mouth daily.     OVER THE COUNTER MEDICATION Multivitamin daily  Fish oil daily     prednisoLONE acetate (PRED FORTE) 1 % ophthalmic suspension Place 1 drop into both eyes 2 (two) times daily as needed (irritation to eye).     psyllium (METAMUCIL) 58.6 % packet Take 1 packet by mouth 2 (two) times daily. 60 packet 2   SUMAtriptan  (IMITREX ) 50 MG tablet Take 1 tablet (50 mg total) by mouth 2 (two) times daily as needed. 45 tablet 4   valACYclovir  (VALTREX ) 500 MG tablet Take  1 tablet (500 mg total) by mouth daily. 90 tablet 3   No current facility-administered medications for this visit.    Review of Systems  Constitutional:  Constitutional negative. HENT: HENT negative.  Respiratory: Positive for shortness of breath.  Cardiovascular: Positive for chest pain.  GI: Gastrointestinal negative.  Musculoskeletal: Positive for leg pain.  Neurological: Positive for numbness.  Hematologic: Hematologic/lymphatic negative.  Psychiatric: Psychiatric negative.        Objective:  Objective  Vitals:   09/05/23 1431  BP: (!) 169/102  Pulse: 79  Temp: 98 F (36.7 C)  SpO2: 97%    Physical Exam HENT:     Head: Normocephalic.     Nose: Nose normal.  Eyes:      Pupils: Pupils are equal, round, and reactive to light.  Cardiovascular:     Rate and Rhythm: Normal rate.     Pulses:          Radial pulses are 2+ on the right side and 2+ on the left side.       Popliteal pulses are 2+ on the right side and 2+ on the left side.       Dorsalis pedis pulses are 2+ on the right side and 2+ on the left side.       Posterior tibial pulses are 3+ on the right side and 3+ on the left side.  Pulmonary:     Effort: Pulmonary effort is normal.  Abdominal:     General: Abdomen is flat. There is no distension.     Palpations: Abdomen is soft. There is no mass.  Skin:    General: Skin is warm.     Capillary Refill: Capillary refill takes less than 2 seconds.  Neurological:     General: No focal deficit present.     Mental Status: He is alert.  Psychiatric:        Mood and Affect: Mood normal.        Thought Content: Thought content normal.        Judgment: Judgment normal.     Data: US  Abdomen IMPRESSION: 1. No acute abnormality identified. 2. Proximal abdominal aorta measures 3 cm. Recommend follow-up ultrasound every 3 years. (Ref.: J Vasc Surg. 2018; 67:2-77 and J Am Coll Radiol 2013;10(10):789-794.)       Assessment/Plan:    58 year old male with very small abdominal aortic aneurysm found on recent ultrasound of his abdomen.  Feels like he is very large radial and posterior tibial pulses and possibly just has large arteries in general.  Either way we will follow-up in 3 years with AAA duplex.     Adine Hoof MD [facility]

## 2023-09-06 ENCOUNTER — Other Ambulatory Visit (HOSPITAL_COMMUNITY): Payer: Self-pay

## 2023-09-06 ENCOUNTER — Other Ambulatory Visit: Payer: Self-pay

## 2023-09-06 MED ORDER — PEG 3350-KCL-NA BICARB-NACL 420 G PO SOLR
4000.0000 mL | Freq: Once | ORAL | 0 refills | Status: AC
  Filled 2023-09-06: qty 4000, 1d supply, fill #0

## 2023-09-06 NOTE — Telephone Encounter (Signed)
 Pt scheduled and instructions sent via my chart. Prep sent to pharmacy.

## 2023-09-06 NOTE — Addendum Note (Signed)
 Addended by: Aries Kasa on: 09/06/2023 12:29 PM   Modules accepted: Orders

## 2023-09-07 ENCOUNTER — Other Ambulatory Visit (HOSPITAL_COMMUNITY): Payer: Self-pay

## 2023-09-07 ENCOUNTER — Encounter (INDEPENDENT_AMBULATORY_CARE_PROVIDER_SITE_OTHER): Payer: Self-pay

## 2023-09-10 ENCOUNTER — Other Ambulatory Visit (HOSPITAL_COMMUNITY): Payer: Self-pay

## 2023-09-10 ENCOUNTER — Other Ambulatory Visit: Payer: Self-pay

## 2023-09-11 ENCOUNTER — Other Ambulatory Visit (HOSPITAL_COMMUNITY): Payer: Self-pay

## 2023-09-11 ENCOUNTER — Telehealth (INDEPENDENT_AMBULATORY_CARE_PROVIDER_SITE_OTHER): Payer: Self-pay | Admitting: Gastroenterology

## 2023-09-11 ENCOUNTER — Other Ambulatory Visit: Payer: Self-pay

## 2023-09-11 MED ORDER — NA SULFATE-K SULFATE-MG SULF 17.5-3.13-1.6 GM/177ML PO SOLN
ORAL | 0 refills | Status: DC
  Filled 2023-09-11: qty 354, 1d supply, fill #0

## 2023-09-11 NOTE — Telephone Encounter (Signed)
 [name], RN  Rozann Cornell, LPN Good Morning!  We were wondering if Xavier Walsh could get a prescription for Suprep instead of trylite for his procedure He used Suprep for his last colonoscopy with Rehman.  Thanks.  Mabelene Savannah  Suprep sent to pharmacy. Instructions sent via mychart

## 2023-09-20 ENCOUNTER — Encounter (HOSPITAL_COMMUNITY): Payer: Self-pay

## 2023-09-22 IMAGING — CT CT HEAD W/O CM
3 series · 16 of 47 positions shown, 19 images · non-contrast
Comparison: 11/26/2002

CLINICAL DATA: Headache.  History of migraine

EXAM:
CT HEAD WITHOUT CONTRAST
TECHNIQUE: Contiguous axial images were obtained from the base of the skull
through the vertex without intravenous contrast.

[Series 2: head w o · axial · 0.42mm/px · z∈[+58,+193]mm · 10 of 33 slices shown, 13 images]
[im 3/33  brain]
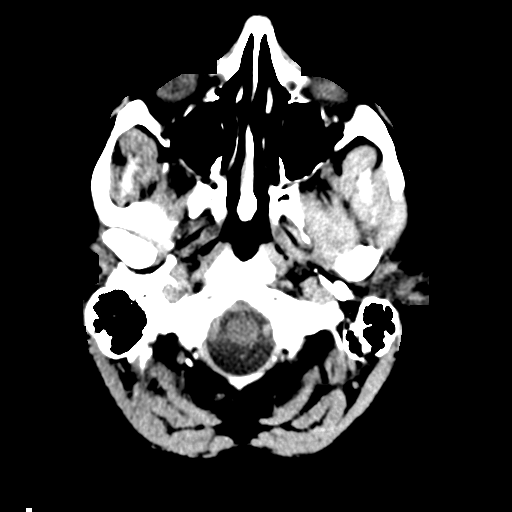
[im 3/33  bone]
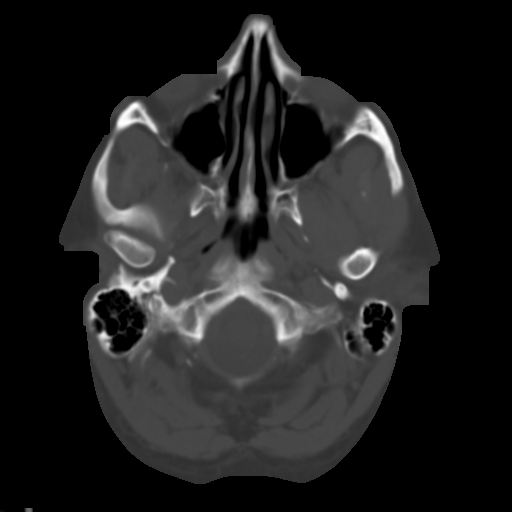
[im 6/33  brain]
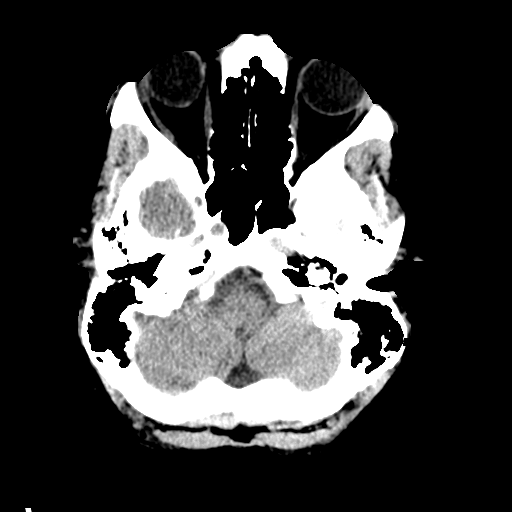
[im 9/33  brain]
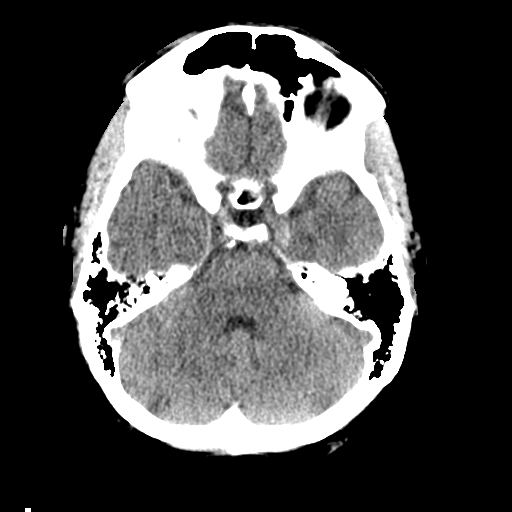
[im 12/33  brain]
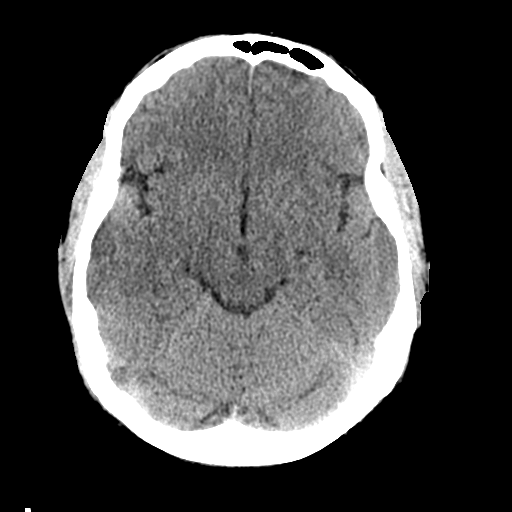
[im 15/33  brain]
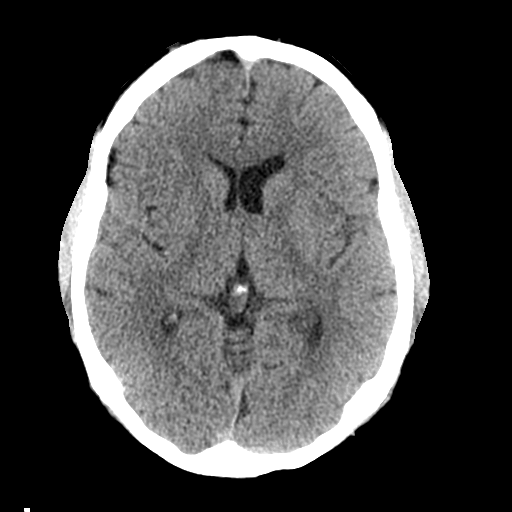
[im 15/33  bone]
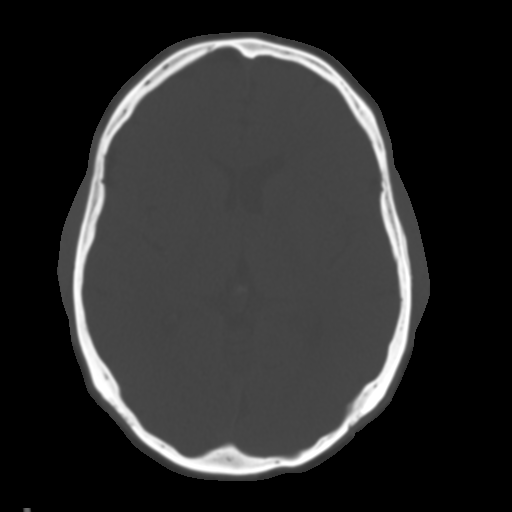
[im 18/33  brain]
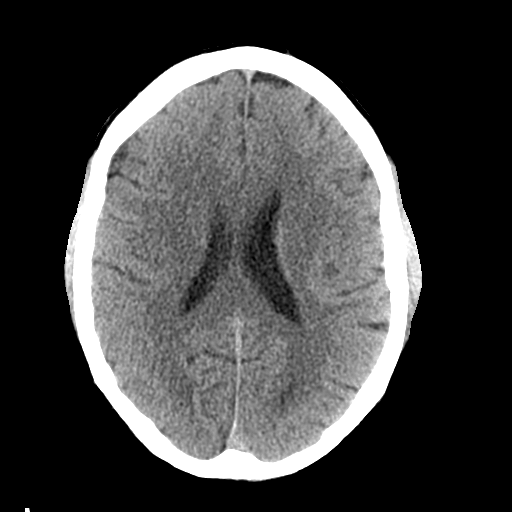
[im 21/33  brain]
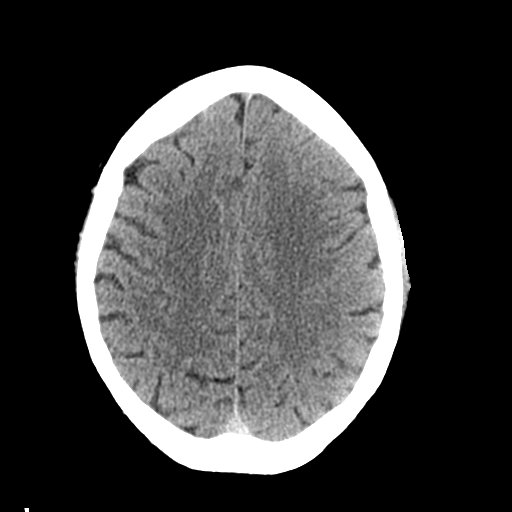
[im 25/33  brain]
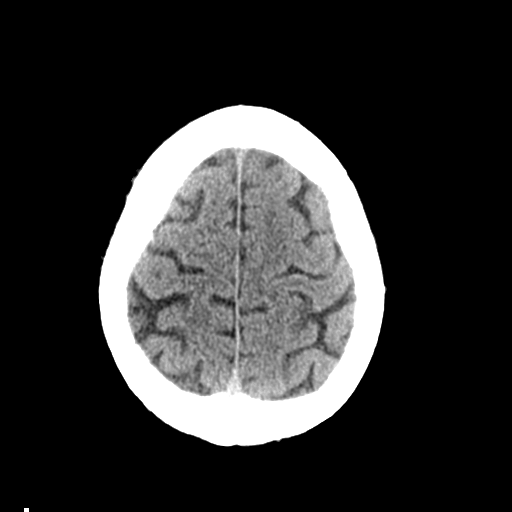
[im 27/33  brain]
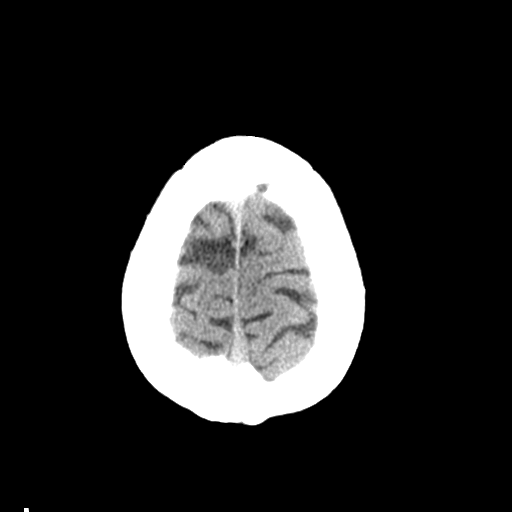
[im 27/33  bone]
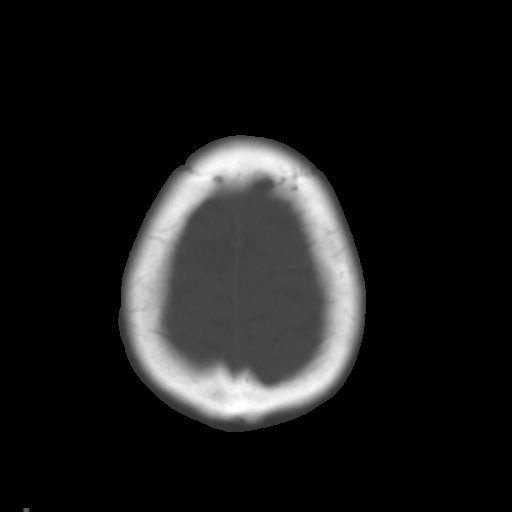
[im 30/33  brain]
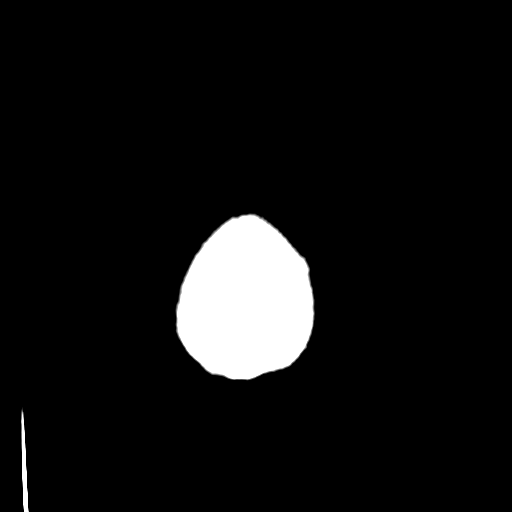

[Series 4: coronal soft · coronal · 0.32mm/px · 3 of 67 slices shown]
[im 23/67  brain]
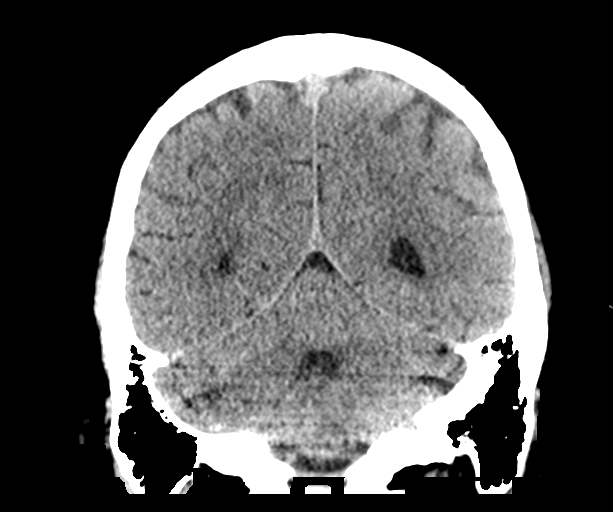
[im 30/67  brain]
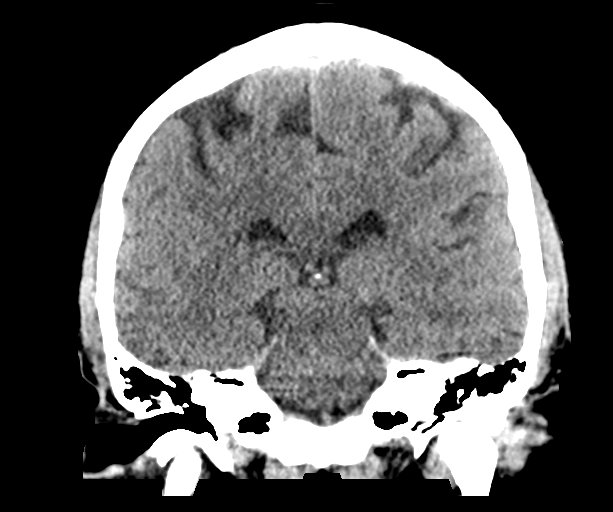
[im 37/67  brain]
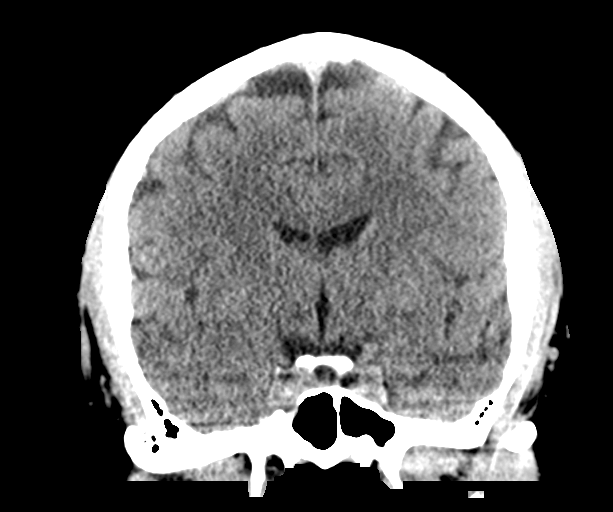

[Series 5: sagittal soft · sagittal · 0.32mm/px · 3 of 59 slices shown]
[im 20/59  brain]
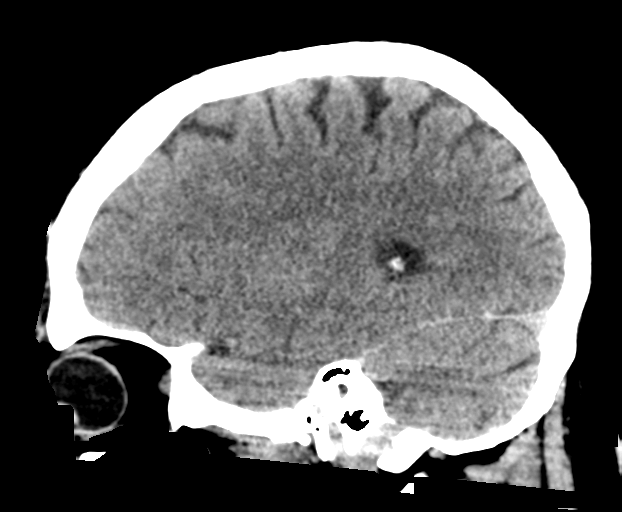
[im 30/59  brain]
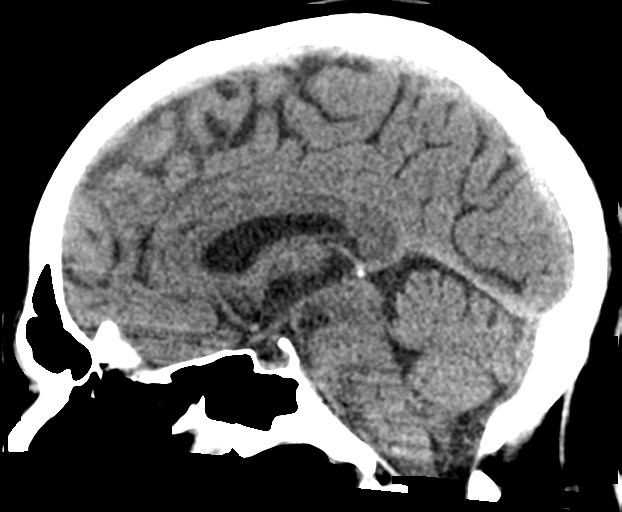
[im 39/59  brain]
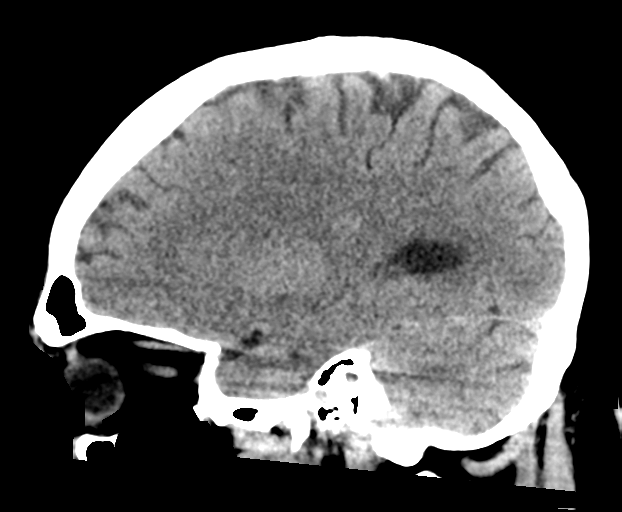

[16 of 47 positions shown; findings below may reference images not displayed]

FINDINGS: Brain: No evidence of acute infarction, hemorrhage, hydrocephalus,
extra-axial collection or mass lesion/mass effect.

Vascular: No hyperdense vessel or unexpected calcification.

Skull: Normal. Negative for fracture or focal lesion.

Sinuses/Orbits: No acute finding.

Other: None.
IMPRESSION: No acute intracranial findings.

## 2023-09-25 ENCOUNTER — Other Ambulatory Visit: Payer: Self-pay

## 2023-09-25 ENCOUNTER — Encounter (HOSPITAL_COMMUNITY)
Admission: RE | Admit: 2023-09-25 | Discharge: 2023-09-25 | Disposition: A | Discharge status: Home / Self Care | Source: Ambulatory Visit | Attending: Gastroenterology | Admitting: Gastroenterology

## 2023-09-25 ENCOUNTER — Encounter (HOSPITAL_COMMUNITY): Payer: Self-pay

## 2023-09-27 ENCOUNTER — Other Ambulatory Visit (HOSPITAL_COMMUNITY): Payer: Self-pay

## 2023-09-27 ENCOUNTER — Encounter (INDEPENDENT_AMBULATORY_CARE_PROVIDER_SITE_OTHER): Payer: Self-pay | Admitting: *Deleted

## 2023-09-27 ENCOUNTER — Ambulatory Visit (HOSPITAL_COMMUNITY)
Admission: RE | Admit: 2023-09-27 | Discharge: 2023-09-27 | Disposition: A | Discharge status: Home / Self Care | Attending: Gastroenterology | Admitting: Gastroenterology

## 2023-09-27 ENCOUNTER — Other Ambulatory Visit: Payer: Self-pay

## 2023-09-27 ENCOUNTER — Encounter (HOSPITAL_COMMUNITY)
Admission: RE | Disposition: A | Discharge status: Home / Self Care | Payer: Self-pay | Source: Home / Self Care | Attending: Gastroenterology

## 2023-09-27 ENCOUNTER — Ambulatory Visit (HOSPITAL_COMMUNITY): Admitting: Anesthesiology

## 2023-09-27 ENCOUNTER — Encounter (HOSPITAL_COMMUNITY): Payer: Self-pay | Admitting: Gastroenterology

## 2023-09-27 DIAGNOSIS — I10 Essential (primary) hypertension: Secondary | ICD-10-CM | POA: Diagnosis not present

## 2023-09-27 DIAGNOSIS — D127 Benign neoplasm of rectosigmoid junction: Secondary | ICD-10-CM | POA: Diagnosis not present

## 2023-09-27 DIAGNOSIS — K621 Rectal polyp: Secondary | ICD-10-CM

## 2023-09-27 DIAGNOSIS — R1310 Dysphagia, unspecified: Secondary | ICD-10-CM | POA: Diagnosis not present

## 2023-09-27 DIAGNOSIS — K222 Esophageal obstruction: Secondary | ICD-10-CM | POA: Insufficient documentation

## 2023-09-27 DIAGNOSIS — K635 Polyp of colon: Secondary | ICD-10-CM

## 2023-09-27 DIAGNOSIS — K5730 Diverticulosis of large intestine without perforation or abscess without bleeding: Secondary | ICD-10-CM

## 2023-09-27 DIAGNOSIS — D128 Benign neoplasm of rectum: Secondary | ICD-10-CM | POA: Diagnosis not present

## 2023-09-27 DIAGNOSIS — D125 Benign neoplasm of sigmoid colon: Secondary | ICD-10-CM

## 2023-09-27 DIAGNOSIS — K219 Gastro-esophageal reflux disease without esophagitis: Secondary | ICD-10-CM | POA: Insufficient documentation

## 2023-09-27 DIAGNOSIS — R519 Headache, unspecified: Secondary | ICD-10-CM | POA: Insufficient documentation

## 2023-09-27 DIAGNOSIS — K317 Polyp of stomach and duodenum: Secondary | ICD-10-CM | POA: Insufficient documentation

## 2023-09-27 DIAGNOSIS — K2970 Gastritis, unspecified, without bleeding: Secondary | ICD-10-CM | POA: Diagnosis not present

## 2023-09-27 DIAGNOSIS — R1013 Epigastric pain: Secondary | ICD-10-CM | POA: Diagnosis not present

## 2023-09-27 DIAGNOSIS — D123 Benign neoplasm of transverse colon: Secondary | ICD-10-CM

## 2023-09-27 DIAGNOSIS — D120 Benign neoplasm of cecum: Secondary | ICD-10-CM | POA: Diagnosis not present

## 2023-09-27 DIAGNOSIS — Z1211 Encounter for screening for malignant neoplasm of colon: Secondary | ICD-10-CM | POA: Insufficient documentation

## 2023-09-27 DIAGNOSIS — K648 Other hemorrhoids: Secondary | ICD-10-CM | POA: Diagnosis not present

## 2023-09-27 DIAGNOSIS — K319 Disease of stomach and duodenum, unspecified: Secondary | ICD-10-CM | POA: Diagnosis not present

## 2023-09-27 DIAGNOSIS — R1319 Other dysphagia: Secondary | ICD-10-CM

## 2023-09-27 HISTORY — PX: ESOPHAGOGASTRODUODENOSCOPY: SHX5428

## 2023-09-27 HISTORY — PX: COLONOSCOPY: SHX5424

## 2023-09-27 LAB — HM COLONOSCOPY

## 2023-09-27 SURGERY — COLONOSCOPY
Anesthesia: General

## 2023-09-27 MED ORDER — LACTATED RINGERS IV SOLN: INTRAVENOUS | Status: DC | PRN

## 2023-09-27 MED ORDER — OMEPRAZOLE 20 MG PO CPDR
20.0000 mg | DELAYED_RELEASE_CAPSULE | Freq: Every day | ORAL | 2 refills | Status: DC
  Filled 2023-09-27: qty 30, 30d supply, fill #0
  Filled 2023-10-26: qty 30, 30d supply, fill #1
  Filled 2023-11-19 – 2023-11-20 (×2): qty 30, 30d supply, fill #2

## 2023-09-27 MED ORDER — PROPOFOL 500 MG/50ML IV EMUL
INTRAVENOUS | Status: DC | PRN
  Administered 2023-09-27: 150 ug/kg/min via INTRAVENOUS

## 2023-09-27 MED ORDER — ONDANSETRON HCL 4 MG/2ML IJ SOLN
INTRAMUSCULAR | Status: DC | PRN
  Administered 2023-09-27: 4 mg via INTRAVENOUS

## 2023-09-27 MED ORDER — GLYCOPYRROLATE PF 0.2 MG/ML IJ SOSY
PREFILLED_SYRINGE | INTRAMUSCULAR | Status: DC | PRN
  Administered 2023-09-27: .2 mg via INTRAVENOUS

## 2023-09-27 MED ORDER — LACTATED RINGERS IV SOLN: INTRAVENOUS | Status: DC

## 2023-09-27 MED ORDER — PROPOFOL 10 MG/ML IV BOLUS
INTRAVENOUS | Status: DC | PRN
  Administered 2023-09-27 (×5): 50 mg via INTRAVENOUS

## 2023-09-27 MED ORDER — LIDOCAINE 2% (20 MG/ML) 5 ML SYRINGE
INTRAMUSCULAR | Status: DC | PRN
  Administered 2023-09-27: 100 mg via INTRAVENOUS

## 2023-09-27 NOTE — Op Note (Signed)
 [facility] Patient Name: Xavier Walsh Procedure Date: 09/27/2023 9:41 AM MRN: [id number] Date of Birth: [birth year] Attending MD: Terril Fetters , MD, [id number] CSN: [id number] Age: 58 Admit Type: Outpatient Procedure:                Colonoscopy Indications:              High risk colon cancer surveillance: Personal                            history of colonic polyps Providers:                Terril Fetters, MD, Marnell Sinks, RN, Karyle Pagoda,                            RN Referring MD:              Medicines:                Monitored Anesthesia Care Complications:            No immediate complications. Estimated Blood Loss:     Estimated blood loss was minimal. Procedure:                Pre-Anesthesia Assessment:                           - Prior to the procedure, a History and Physical                            was performed, and patient medications and                            allergies were reviewed. The patient's tolerance of                            previous anesthesia was also reviewed. The risks                            and benefits of the procedure and the sedation                            options and risks were discussed with the patient.                            All questions were answered, and informed consent                            was obtained. Prior Anticoagulants: The patient has                            taken no anticoagulant or antiplatelet agents. ASA                            Grade Assessment: II - A patient with mild systemic  disease. After reviewing the risks and benefits,                            the patient was deemed in satisfactory condition to                            undergo the procedure.                           After obtaining informed consent, the colonoscope                            was passed under direct vision. Throughout the                            procedure, the patient's blood  pressure, pulse, and                            oxygen saturations were monitored continuously. The                            [id number]-[id number] ([id number]) scope was introduced through                            the anus and advanced to the the cecum, identified                            by appendiceal orifice and ileocecal valve. The                            colonoscopy was performed without difficulty. The                            patient tolerated the procedure well. The quality                            of the bowel preparation was evaluated using the                            BBPS [facility] Bowel Preparation Scale) with scores                            of: Right Colon = 3, Transverse Colon = 3 and Left                            Colon = 3 (entire mucosa seen well with no residual                            staining, small fragments of stool or opaque                            liquid). The total BBPS score equals 9. The  ileocecal valve, appendiceal orifice, and rectum                            were photographed. Scope In: 10:12:45 AM Scope Out: 10:39:59 AM Scope Withdrawal Time: 0 hours 24 minutes 40 seconds  Total Procedure Duration: 0 hours 27 minutes 14 seconds  Findings:      The perianal and digital rectal examinations were normal.      Five sessile polyps were found in the rectum, sigmoid colon, transverse       colon and cecum. The polyps were 3 to 7 mm in size. These polyps were       removed with a cold snare. Resection and retrieval were complete.      A few small-mouthed diverticula were found in the left colon.      Non-bleeding internal hemorrhoids were found during retroflexion. The       hemorrhoids were small. Impression:               - Five 3 to 7 mm polyps in the rectum, in the                            sigmoid colon, in the transverse colon and in the                            cecum, removed with a cold snare. Resected and                             retrieved.                           - Diverticulosis in the left colon.                           - Non-bleeding internal hemorrhoids. Moderate Sedation:      Per Anesthesia Care Recommendation:           - Patient has a contact number available for                            emergencies. The signs and symptoms of potential                            delayed complications were discussed with the                            patient. Return to normal activities tomorrow.                            Written discharge instructions were provided to the                            patient.                           - Resume previous diet.                           -  Continue present medications.                           - Await pathology results.                           - Repeat colonoscopy in 5 years for surveillance                            based on pathology results.                           - Return to primary care physician as previously                            scheduled. Procedure Code(s):        --- Professional ---                           [phone], Colonoscopy, flexible; with removal of                            tumor(s), polyp(s), or other lesion(s) by snare                            technique Diagnosis Code(s):        --- Professional ---                           Z86.010, Personal history of colonic polyps                           D12.8, Benign neoplasm of rectum                           D12.5, Benign neoplasm of sigmoid colon                           D12.3, Benign neoplasm of transverse colon (hepatic                            flexure or splenic flexure)                           D12.0, Benign neoplasm of cecum                           K64.8, Other hemorrhoids                           K57.30, Diverticulosis of large intestine without                            perforation or abscess without bleeding CPT copyright 2022 American Medical Association.  All rights reserved. The codes documented in this report are preliminary and upon coder review may  be revised to meet current compliance requirements. Terril Fetters, MD Terril Fetters, MD 09/27/2023 10:50:19  AM This report has been signed electronically. Number of Addenda: 0

## 2023-09-27 NOTE — Transfer of Care (Signed)
 Immediate Anesthesia Transfer of Care Note  Patient: Xavier Walsh  Procedure(s) Performed: COLONOSCOPY EGD (ESOPHAGOGASTRODUODENOSCOPY)  Patient Location: PACU and Endoscopy Unit  Anesthesia Type:MAC  Level of Consciousness: awake and alert   Airway & Oxygen Therapy: Patient Spontanous Breathing and Patient connected to nasal cannula oxygen  Post-op Assessment: Report given to RN and Post -op Vital signs reviewed and stable  Post vital signs: Reviewed and stable  Last Vitals:  Vitals Value Taken Time  BP    Temp    Pulse    Resp    SpO2      Last Pain:  Vitals:   09/27/23 0954  TempSrc:   PainSc: 0-No pain         Complications: No notable events documented.

## 2023-09-27 NOTE — Discharge Instructions (Signed)

## 2023-09-27 NOTE — Anesthesia Preprocedure Evaluation (Signed)
Anesthesia Evaluation  Patient identified by MRN, date of birth, ID band Patient awake    Reviewed: Allergy & Precautions, H&P , NPO status , Patient's Chart, lab work & pertinent test results, reviewed documented beta blocker date and time   Airway Mallampati: II  TM Distance: >3 FB Neck ROM: full    Dental no notable dental hx.    Pulmonary neg pulmonary ROS   Pulmonary exam normal breath sounds clear to auscultation       Cardiovascular Exercise Tolerance: Good hypertension, negative cardio ROS  Rhythm:regular Rate:Normal     Neuro/Psych  Headaches negative neurological ROS  negative psych ROS   GI/Hepatic negative GI ROS, Neg liver ROS,GERD  ,,  Endo/Other  negative endocrine ROS    Renal/GU negative Renal ROS  negative genitourinary   Musculoskeletal   Abdominal   Peds  Hematology negative hematology ROS (+)   Anesthesia Other Findings   Reproductive/Obstetrics negative OB ROS                             Anesthesia Physical Anesthesia Plan  ASA: 2  Anesthesia Plan: General   Post-op Pain Management:    Induction:   PONV Risk Score and Plan: Propofol infusion  Airway Management Planned:   Additional Equipment:   Intra-op Plan:   Post-operative Plan:   Informed Consent: I have reviewed the patients History and Physical, chart, labs and discussed the procedure including the risks, benefits and alternatives for the proposed anesthesia with the patient or authorized representative who has indicated his/her understanding and acceptance.     Dental Advisory Given  Plan Discussed with: CRNA  Anesthesia Plan Comments:        Anesthesia Quick Evaluation

## 2023-09-27 NOTE — H&P (Signed)
 Primary Care Physician:  Orlena Bitters, MD Primary Gastroenterologist:  Dr. Alita Irwin  Pre-Procedure History & Physical: HPI:  Xavier Walsh is a 58 y.o. male with HTN , chronic GERD who presents for evaluation of Intermittent dysphagia , Abdominal discomfort GERD and Surveillance colonoscopy    Last EGD:2019   - Normal esophagus. - Benign- appearing esophageal stenosis. Dilated. - Multiple gastric polyps. Biopsied. - Normal duodenal bulb and second portion of the duodenum.   Last Colonoscopy:2019   - Diverticulosis in the sigmoid colon. - Two small polyps in the rectum and in the sigmoid colon, removed with a cold snare. Resected and retrieved. - Internal hemorrhoids.   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 Both colonic polyps are tubular adenomas.  Next colonoscopy in 5 years.  Past Medical History:  Diagnosis Date   GERD (gastroesophageal reflux disease)    Headache    migranes   Hypertension     Past Surgical History:  Procedure Laterality Date   BIOPSY  01/28/2018   Procedure: BIOPSY;  Surgeon: Ruby Corporal, MD;  [facility];  Service: Endoscopy;;  gastric   COLONOSCOPY N/A 01/28/2018   Procedure: COLONOSCOPY;  Surgeon: Ruby Corporal, MD;  [facility];  Service: Endoscopy;  Laterality: N/A;  730   ESOPHAGOGASTRODUODENOSCOPY N/A 01/28/2018   Procedure: ESOPHAGOGASTRODUODENOSCOPY (EGD);  Surgeon: Ruby Corporal, MD;  [facility];  Service: Endoscopy;  Laterality: N/A;   EYE SURGERY     bilateral   KNEE CARTILAGE SURGERY Right    POLYPECTOMY  01/28/2018   Procedure: POLYPECTOMY;  Surgeon: Ruby Corporal, MD;  [facility];  Service: Endoscopy;;  colon    Prior to Admission medications   Medication Sig Start Date End Date Taking? Authorizing Provider  amLODipine  (NORVASC ) 5 MG tablet Take 1 tablet (5 mg total) by mouth daily. 07/16/23  Yes Lasalle Pointer, NP  lisinopril  (ZESTRIL ) 20 MG tablet  Take 1 tablet (20 mg total) by mouth daily. 05/23/23  Yes Lasalle Pointer, NP  metoprolol  succinate (TOPROL  XL) 25 MG 24 hr tablet Take 0.5 tablets (12.5 mg total) by mouth daily. 05/07/23  Yes Lasalle Pointer, NP  omeprazole (PRILOSEC) 20 MG capsule Take 20 mg by mouth daily.   Yes [provider]  OVER THE COUNTER MEDICATION Multivitamin daily  Fish oil daily   Yes [provider]  prednisoLONE acetate (PRED FORTE) 1 % ophthalmic suspension Place 1 drop into both eyes 2 (two) times daily as needed (irritation to eye).   Yes [provider]  psyllium (METAMUCIL) 58.6 % packet Take 1 packet by mouth 2 (two) times daily. 08/14/23 11/12/23 Yes Eryn Krejci, Forbes Ida, MD  SUMAtriptan  (IMITREX ) 50 MG tablet Take 1 tablet (50 mg total) by mouth 2 (two) times daily as needed. 10/13/22  Yes   valACYclovir  (VALTREX ) 500 MG tablet Take 1 tablet (500 mg total) by mouth daily. 10/13/22  Yes   Na Sulfate-K Sulfate-Mg Sulfate concentrate (SUPREP) 17.5-3.13-1.6 GM/177ML SOLN Use as directed 09/11/23   Mathan Darroch F, MD  nortriptyline  (PAMELOR ) 10 MG capsule Take 2 capsules (20 mg total) by mouth at bedtime. 10/13/22       Allergies as of 09/06/2023 - Review Complete 09/05/2023  Allergen Reaction Noted   Iodine-131 Anaphylaxis 06/08/2011   Reglan [metoclopramide] Other (See Comments) 07/18/2017    Family History  Problem Relation Age of Onset   Panic disorder Mother    Diabetes Father  Hypertension Father    Hiatal hernia Father     Social History   Socioeconomic History   Marital status: Married    Spouse name: Not on file   Number of children: Not on file   Years of education: Not on file   Highest education level: Not on file  Occupational History   Not on file  Tobacco Use   Smoking status: Never   Smokeless tobacco: Never  Vaping Use   Vaping status: Never Used  Substance and Sexual Activity   Alcohol use: Never   Drug use: Never   Sexual activity: Not on file   Other Topics Concern   Not on file  Social History Narrative   Not on file   Social Drivers of Health   Financial Resource Strain: Not on file  Food Insecurity: Not on file  Transportation Needs: Not on file  Physical Activity: Not on file  Stress: Not on file  Social Connections: Not on file  Intimate Partner Violence: Not on file    Review of Systems: See HPI, otherwise negative ROS  Physical Exam: Vital signs in last 24 hours: Temp:  [98.1 F (36.7 C)] 98.1 F (36.7 C) (05/15 0833) Pulse Rate:  [77] 77 (05/15 0833) Resp:  [20] 20 (05/15 0833) BP: (173)/(101) 173/101 (05/15 0833) SpO2:  [100 %] 100 % (05/15 0833)   General:   Alert,  Well-developed, well-nourished, pleasant and cooperative in NAD Head:  Normocephalic and atraumatic. Eyes:  Sclera clear, no icterus.   Conjunctiva pink. Ears:  Normal auditory acuity. Nose:  No deformity, discharge,  or lesions. Msk:  Symmetrical without gross deformities. Normal posture. Extremities:  Without clubbing or edema. Neurologic:  Alert and  oriented x4;  grossly normal neurologically. Skin:  Intact without significant lesions or rashes. Psych:  Alert and cooperative. Normal mood and affect.  Xavier Walsh is a 58 y.o. male with HTN , chronic GERD who presents for evaluation of Intermittent dysphagia , Abdominal discomfort GERD and Surveillance colonoscopy . Proceed with EGD with dilation and surveillance colonoscopy   The risks of the procedure including infection, bleed, or perforation as well as benefits, limitations, alternatives and imponderables have been reviewed with the patient. Questions have been answered. All parties agreeable.

## 2023-09-27 NOTE — Op Note (Signed)
 [facility] Patient Name: Xavier Walsh Procedure Date: 09/27/2023 9:45 AM MRN: [id number] Date of Birth: 1966/04/17 Attending MD: Terril Fetters , MD, [id number] CSN: [id number] Age: 58 Admit Type: Outpatient Procedure:                Upper GI endoscopy Indications:              Epigastric abdominal pain, Dysphagia, For therapy                            of gastro-esophageal reflux disease Providers:                Terril Fetters, MD, Marnell Sinks, RN, Karyle Pagoda,                            RN Referring MD:              Medicines:                Monitored Anesthesia Care Complications:            No immediate complications. Estimated Blood Loss:     Estimated blood loss was minimal. Procedure:                Pre-Anesthesia Assessment:                           - Prior to the procedure, a History and Physical                            was performed, and patient medications and                            allergies were reviewed. The patient's tolerance of                            previous anesthesia was also reviewed. The risks                            and benefits of the procedure and the sedation                            options and risks were discussed with the patient.                            All questions were answered, and informed consent                            was obtained. Prior Anticoagulants: The patient has                            taken no anticoagulant or antiplatelet agents. ASA                            Grade Assessment: II - A patient with mild systemic  disease. After reviewing the risks and benefits,                            the patient was deemed in satisfactory condition to                            undergo the procedure.                           After obtaining informed consent, the endoscope was                            passed under direct vision. Throughout the                            procedure, the  patient's blood pressure, pulse, and                            oxygen saturations were monitored continuously. The                            GIF-H190 ([id number]) scope was introduced through the                            mouth, and advanced to the second part of duodenum.                            The upper GI endoscopy was accomplished without                            difficulty. The patient tolerated the procedure                            well. Scope In: 9:59:36 AM Scope Out: 10:08:10 AM Total Procedure Duration: 0 hours 8 minutes 34 seconds  Findings:      One benign-appearing, intrinsic mild stenosis was found. The stenosis       was traversed. A guidewire was placed and the scope was withdrawn.       Dilation was performed with a Savary dilator with moderate resistance at       18 Fr. The dilation site was examined and showed mild mucosal       disruption. Biopsies were obtained from the proximal and distal       esophagus with cold forceps for histology of suspected eosinophilic       esophagitis.      Inflammation characterized by erythema was found in the gastric body.       Biopsies were taken with a cold forceps for histology.      Multiple sessile polyps with no bleeding and no stigmata of recent       bleeding were found in the stomach.      The duodenal bulb and second portion of the duodenum were normal. Impression:               - Benign-appearing esophageal stenosis. Dilated.                           -  Gastritis. Biopsied.                           - Multiple gastric polyps, fundic gland polyp                            appearing as established from previous EGD in 2019                           - Normal duodenal bulb and second portion of the                            duodenum.                           - Biopsies were taken with a cold forceps for                            evaluation of eosinophilic esophagitis. Moderate Sedation:      Per Anesthesia  Care Recommendation:           - Patient has a contact number available for                            emergencies. The signs and symptoms of potential                            delayed complications were discussed with the                            patient. Return to normal activities tomorrow.                            Written discharge instructions were provided to the                            patient.                           - Resume previous diet.                           - Continue present medications.                           - Await pathology results. Procedure Code(s):        --- Professional ---                           [phone], Esophagogastroduodenoscopy, flexible,                            transoral; with insertion of guide wire followed by                            passage of dilator(s) through esophagus over guide  wire                           43239, 59, Esophagogastroduodenoscopy, flexible,                            transoral; with biopsy, single or multiple Diagnosis Code(s):        --- Professional ---                           K22.2, Esophageal obstruction                           K29.70, Gastritis, unspecified, without bleeding                           K31.7, Polyp of stomach and duodenum                           R10.13, Epigastric pain                           R13.10, Dysphagia, unspecified                           K21.9, Gastro-esophageal reflux disease without                            esophagitis CPT copyright 2022 American Medical Association. All rights reserved. The codes documented in this report are preliminary and upon coder review may  be revised to meet current compliance requirements. Terril Fetters, MD Terril Fetters, MD 09/27/2023 10:47:07 AM This report has been signed electronically. Number of Addenda: 0

## 2023-09-28 ENCOUNTER — Ambulatory Visit (INDEPENDENT_AMBULATORY_CARE_PROVIDER_SITE_OTHER): Payer: Self-pay | Admitting: Gastroenterology

## 2023-09-28 LAB — SURGICAL PATHOLOGY

## 2023-09-28 NOTE — Progress Notes (Signed)
 I reviewed the pathology results. Ann, can you send her a letter with the findings as described below please?  Repeat colonoscopy in 3 years  Thanks,  Joyceann Kruser Faizan Airlie Blumenberg, MD Gastroenterology and Hepatology [facility]  ---------------------------------------------------------------------------------------------  [facility] [address], [address], [place], [place] [address] Phone:  [phone]   09/28/23 Xavier Walsh, [place]   Dear Xavier Walsh,  I am writing to inform you that the biopsies taken during your recent endoscopic examination showed:  FINAL MICROSCOPIC DIAGNOSIS:   A. STOMACH BIOPSY:  -  Oxyntic mucosa with no significant pathology.  -  No Helicobacter pylori organisms identified on HE stained slide.   B. ESOPHAGEAL BIOPSY:  -  Squamous mucosa with no significant pathology.   C. 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  -  Fragments of sessile serrated polyp (given the multiplicity of polyps  and fragmented nature of the specimen, it is not morphologically  possible to distinguish separate sessile serrated polyps and tubular  adenomas from possible sessile serrated polyps with dysplasia).   What does this mean?  I am writing to let you know the results of your recent colonoscopy.  You had a total of 5 polyps removed. The pathology came back as "sessile serrated with possible dysplasia and tubular adenoma" polyp. . These findings are NOT cancer, but had the polyps remained in your colon, they could have turn into cancer.  Given these findings, it is recommended that your next colonoscopy be performed in 3 years.  Also upper endoscopy biopsies did not show anything concerning   Also I value your feedback , so if you get a survey , please take the time  to fill it out and thank you for choosing [facility]  Please call us  at [phone] if you have persistent problems or have questions about your condition that have not been fully answered at this time.  Sincerely,  Bethanie Bloxom Faizan Florian Chauca, MD Gastroenterology and Hepatology

## 2023-10-02 ENCOUNTER — Encounter (HOSPITAL_COMMUNITY): Payer: Self-pay | Admitting: Gastroenterology

## 2023-10-09 ENCOUNTER — Other Ambulatory Visit: Payer: Self-pay | Admitting: Nurse Practitioner

## 2023-10-10 ENCOUNTER — Other Ambulatory Visit: Payer: Self-pay

## 2023-10-10 ENCOUNTER — Other Ambulatory Visit (HOSPITAL_COMMUNITY): Payer: Self-pay

## 2023-10-10 MED ORDER — LISINOPRIL 20 MG PO TABS
20.0000 mg | ORAL_TABLET | Freq: Every day | ORAL | 3 refills | Status: DC
  Filled 2023-10-10: qty 30, 30d supply, fill #0
  Filled 2023-11-07: qty 30, 30d supply, fill #1
  Filled 2023-12-01 – 2023-12-06 (×2): qty 30, 30d supply, fill #2
  Filled 2024-01-03: qty 30, 30d supply, fill #3

## 2023-10-13 ENCOUNTER — Other Ambulatory Visit (HOSPITAL_COMMUNITY): Payer: Self-pay

## 2023-10-14 NOTE — Anesthesia Postprocedure Evaluation (Signed)
 Anesthesia Post Note  Patient: Xavier Walsh  Procedure(s) Performed: COLONOSCOPY EGD (ESOPHAGOGASTRODUODENOSCOPY)  Patient location during evaluation: Phase II Anesthesia Type: General Level of consciousness: awake Pain management: pain level controlled Vital Signs Assessment: post-procedure vital signs reviewed and stable Respiratory status: spontaneous breathing and respiratory function stable Cardiovascular status: blood pressure returned to baseline and stable Postop Assessment: no headache and no apparent nausea or vomiting Anesthetic complications: no Comments: Late entry   No notable events documented.   Last Vitals:  Vitals:   09/27/23 0833 09/27/23 1051  BP: (!) 173/101 107/69  Pulse: 77 91  Resp: 20 18  Temp: 36.7 C 36.4 C  SpO2: 100% 97%    Last Pain:  Vitals:   09/28/23 1525  TempSrc:   PainSc: 0-No pain                 Coretha Dew

## 2023-10-15 ENCOUNTER — Other Ambulatory Visit: Payer: Self-pay

## 2023-10-15 ENCOUNTER — Other Ambulatory Visit (HOSPITAL_COMMUNITY): Payer: Self-pay

## 2023-10-15 MED ORDER — NORTRIPTYLINE HCL 10 MG PO CAPS
20.0000 mg | ORAL_CAPSULE | Freq: Every day | ORAL | 4 refills | Status: AC
  Filled 2023-10-15: qty 180, 90d supply, fill #0
  Filled 2023-11-19 – 2024-01-20 (×2): qty 180, 90d supply, fill #1
  Filled 2024-04-18 (×2): qty 180, 90d supply, fill #2

## 2023-10-22 ENCOUNTER — Other Ambulatory Visit (HOSPITAL_COMMUNITY): Payer: Self-pay

## 2023-10-22 DIAGNOSIS — Z79899 Other long term (current) drug therapy: Secondary | ICD-10-CM | POA: Diagnosis not present

## 2023-10-22 DIAGNOSIS — Z0000 Encounter for general adult medical examination without abnormal findings: Secondary | ICD-10-CM | POA: Diagnosis not present

## 2023-10-22 DIAGNOSIS — F419 Anxiety disorder, unspecified: Secondary | ICD-10-CM | POA: Diagnosis not present

## 2023-10-22 DIAGNOSIS — Z125 Encounter for screening for malignant neoplasm of prostate: Secondary | ICD-10-CM | POA: Diagnosis not present

## 2023-10-22 DIAGNOSIS — Z1331 Encounter for screening for depression: Secondary | ICD-10-CM | POA: Diagnosis not present

## 2023-10-22 DIAGNOSIS — Z299 Encounter for prophylactic measures, unspecified: Secondary | ICD-10-CM | POA: Diagnosis not present

## 2023-10-22 DIAGNOSIS — E7800 Pure hypercholesterolemia, unspecified: Secondary | ICD-10-CM | POA: Diagnosis not present

## 2023-10-22 DIAGNOSIS — I10 Essential (primary) hypertension: Secondary | ICD-10-CM | POA: Diagnosis not present

## 2023-10-22 MED ORDER — VALACYCLOVIR HCL 500 MG PO TABS
500.0000 mg | ORAL_TABLET | Freq: Every day | ORAL | 3 refills | Status: AC
  Filled 2023-10-22 – 2023-12-01 (×2): qty 90, 90d supply, fill #0
  Filled 2024-03-05: qty 90, 90d supply, fill #1
  Filled 2024-06-01 – 2024-06-03 (×3): qty 90, 90d supply, fill #2

## 2023-10-23 ENCOUNTER — Other Ambulatory Visit (HOSPITAL_COMMUNITY): Payer: Self-pay

## 2023-10-26 ENCOUNTER — Other Ambulatory Visit (HOSPITAL_COMMUNITY): Payer: Self-pay

## 2023-10-30 DIAGNOSIS — L738 Other specified follicular disorders: Secondary | ICD-10-CM | POA: Diagnosis not present

## 2023-10-30 DIAGNOSIS — I788 Other diseases of capillaries: Secondary | ICD-10-CM | POA: Diagnosis not present

## 2023-10-30 DIAGNOSIS — D485 Neoplasm of uncertain behavior of skin: Secondary | ICD-10-CM | POA: Diagnosis not present

## 2023-10-30 DIAGNOSIS — L905 Scar conditions and fibrosis of skin: Secondary | ICD-10-CM | POA: Diagnosis not present

## 2023-10-30 DIAGNOSIS — D225 Melanocytic nevi of trunk: Secondary | ICD-10-CM | POA: Diagnosis not present

## 2023-10-30 DIAGNOSIS — Z85828 Personal history of other malignant neoplasm of skin: Secondary | ICD-10-CM | POA: Diagnosis not present

## 2023-10-30 DIAGNOSIS — D2272 Melanocytic nevi of left lower limb, including hip: Secondary | ICD-10-CM | POA: Diagnosis not present

## 2023-10-30 DIAGNOSIS — L918 Other hypertrophic disorders of the skin: Secondary | ICD-10-CM | POA: Diagnosis not present

## 2023-11-08 ENCOUNTER — Other Ambulatory Visit (HOSPITAL_COMMUNITY): Payer: Self-pay

## 2023-11-20 ENCOUNTER — Other Ambulatory Visit: Payer: Self-pay

## 2023-11-20 ENCOUNTER — Other Ambulatory Visit (HOSPITAL_COMMUNITY): Payer: Self-pay

## 2023-11-20 DIAGNOSIS — M19011 Primary osteoarthritis, right shoulder: Secondary | ICD-10-CM | POA: Diagnosis not present

## 2023-11-20 DIAGNOSIS — M25561 Pain in right knee: Secondary | ICD-10-CM | POA: Diagnosis not present

## 2023-11-20 DIAGNOSIS — M19012 Primary osteoarthritis, left shoulder: Secondary | ICD-10-CM | POA: Diagnosis not present

## 2023-11-20 MED ORDER — LORAZEPAM 0.5 MG PO TABS
ORAL_TABLET | ORAL | 0 refills | Status: DC
  Filled 2023-11-20: qty 2, 1d supply, fill #0

## 2023-12-03 ENCOUNTER — Other Ambulatory Visit (HOSPITAL_COMMUNITY): Payer: Self-pay

## 2023-12-03 ENCOUNTER — Other Ambulatory Visit: Payer: Self-pay

## 2023-12-04 ENCOUNTER — Other Ambulatory Visit: Payer: Self-pay | Admitting: Nurse Practitioner

## 2023-12-04 ENCOUNTER — Other Ambulatory Visit: Payer: Self-pay

## 2023-12-05 ENCOUNTER — Other Ambulatory Visit: Payer: Self-pay

## 2023-12-07 ENCOUNTER — Other Ambulatory Visit (HOSPITAL_COMMUNITY): Payer: Self-pay

## 2023-12-12 DIAGNOSIS — M25561 Pain in right knee: Secondary | ICD-10-CM | POA: Diagnosis not present

## 2023-12-14 DIAGNOSIS — H179 Unspecified corneal scar and opacity: Secondary | ICD-10-CM | POA: Diagnosis not present

## 2023-12-14 DIAGNOSIS — B0051 Herpesviral iridocyclitis: Secondary | ICD-10-CM | POA: Diagnosis not present

## 2023-12-14 DIAGNOSIS — H16041 Marginal corneal ulcer, right eye: Secondary | ICD-10-CM | POA: Diagnosis not present

## 2023-12-14 DIAGNOSIS — B958 Unspecified staphylococcus as the cause of diseases classified elsewhere: Secondary | ICD-10-CM | POA: Diagnosis not present

## 2023-12-14 DIAGNOSIS — H2513 Age-related nuclear cataract, bilateral: Secondary | ICD-10-CM | POA: Diagnosis not present

## 2023-12-17 DIAGNOSIS — B958 Unspecified staphylococcus as the cause of diseases classified elsewhere: Secondary | ICD-10-CM | POA: Diagnosis not present

## 2023-12-17 DIAGNOSIS — H16041 Marginal corneal ulcer, right eye: Secondary | ICD-10-CM | POA: Diagnosis not present

## 2023-12-17 DIAGNOSIS — B0051 Herpesviral iridocyclitis: Secondary | ICD-10-CM | POA: Diagnosis not present

## 2023-12-17 DIAGNOSIS — H179 Unspecified corneal scar and opacity: Secondary | ICD-10-CM | POA: Diagnosis not present

## 2023-12-19 ENCOUNTER — Other Ambulatory Visit (INDEPENDENT_AMBULATORY_CARE_PROVIDER_SITE_OTHER): Payer: Self-pay | Admitting: Gastroenterology

## 2023-12-19 DIAGNOSIS — S83281D Other tear of lateral meniscus, current injury, right knee, subsequent encounter: Secondary | ICD-10-CM | POA: Diagnosis not present

## 2023-12-19 DIAGNOSIS — S83241D Other tear of medial meniscus, current injury, right knee, subsequent encounter: Secondary | ICD-10-CM | POA: Diagnosis not present

## 2023-12-20 ENCOUNTER — Other Ambulatory Visit (HOSPITAL_COMMUNITY): Payer: Self-pay

## 2023-12-20 ENCOUNTER — Other Ambulatory Visit (HOSPITAL_BASED_OUTPATIENT_CLINIC_OR_DEPARTMENT_OTHER): Payer: Self-pay

## 2023-12-20 MED ORDER — OMEPRAZOLE 20 MG PO CPDR
20.0000 mg | DELAYED_RELEASE_CAPSULE | Freq: Every day | ORAL | 2 refills | Status: DC
  Filled 2023-12-20: qty 30, 30d supply, fill #0
  Filled 2024-01-20: qty 30, 30d supply, fill #1
  Filled 2024-02-26: qty 30, 30d supply, fill #2

## 2023-12-25 ENCOUNTER — Telehealth: Payer: Self-pay | Admitting: Nurse Practitioner

## 2023-12-25 ENCOUNTER — Other Ambulatory Visit (HOSPITAL_COMMUNITY): Payer: Self-pay

## 2023-12-25 NOTE — Telephone Encounter (Signed)
*  STAT* If patient is at the pharmacy, call can be transferred to refill team.   1. Which medications need to be refilled? (please list name of each medication and dose if known) metoprolol  succinate (TOPROL  XL) 25 MG 24 hr tablet    4. Which pharmacy/location (including street and city if local pharmacy) is medication to be sent to?  [facility]     5. Do they need a 30 day or 90 day supply? 90   Pt states he is completely out

## 2023-12-26 ENCOUNTER — Other Ambulatory Visit (HOSPITAL_COMMUNITY): Payer: Self-pay

## 2023-12-26 MED ORDER — METOPROLOL SUCCINATE ER 25 MG PO TB24
12.5000 mg | ORAL_TABLET | Freq: Every day | ORAL | 2 refills | Status: DC
  Filled 2023-12-26 (×2): qty 45, 90d supply, fill #0

## 2023-12-26 MED ORDER — METOPROLOL SUCCINATE ER 25 MG PO TB24
12.5000 mg | ORAL_TABLET | Freq: Every day | ORAL | 2 refills | Status: AC
  Filled 2023-12-26: qty 45, 90d supply, fill #0
  Filled 2024-03-23 – 2024-03-25 (×2): qty 45, 90d supply, fill #1
  Filled 2024-06-18: qty 45, 90d supply, fill #2

## 2023-12-26 NOTE — Telephone Encounter (Signed)
 Rx sent to pharmacy

## 2023-12-26 NOTE — Addendum Note (Signed)
 Addended by: DARIO IZETTA CROME on: 12/26/2023 08:39 AM   Modules accepted: Orders

## 2023-12-27 ENCOUNTER — Other Ambulatory Visit: Payer: Self-pay

## 2024-01-02 DIAGNOSIS — H2513 Age-related nuclear cataract, bilateral: Secondary | ICD-10-CM | POA: Diagnosis not present

## 2024-01-02 DIAGNOSIS — H179 Unspecified corneal scar and opacity: Secondary | ICD-10-CM | POA: Diagnosis not present

## 2024-01-02 DIAGNOSIS — B958 Unspecified staphylococcus as the cause of diseases classified elsewhere: Secondary | ICD-10-CM | POA: Diagnosis not present

## 2024-01-02 DIAGNOSIS — B0051 Herpesviral iridocyclitis: Secondary | ICD-10-CM | POA: Diagnosis not present

## 2024-01-02 DIAGNOSIS — H16041 Marginal corneal ulcer, right eye: Secondary | ICD-10-CM | POA: Diagnosis not present

## 2024-01-03 ENCOUNTER — Other Ambulatory Visit: Payer: Self-pay | Admitting: Nurse Practitioner

## 2024-01-03 ENCOUNTER — Other Ambulatory Visit (HOSPITAL_COMMUNITY): Payer: Self-pay

## 2024-01-04 ENCOUNTER — Other Ambulatory Visit (HOSPITAL_COMMUNITY): Payer: Self-pay

## 2024-01-04 ENCOUNTER — Other Ambulatory Visit: Payer: Self-pay

## 2024-01-04 MED ORDER — AMLODIPINE BESYLATE 5 MG PO TABS
5.0000 mg | ORAL_TABLET | Freq: Every day | ORAL | 1 refills | Status: AC
  Filled 2024-01-04: qty 90, 90d supply, fill #0
  Filled 2024-04-05 – 2024-04-08 (×2): qty 90, 90d supply, fill #1

## 2024-01-07 ENCOUNTER — Telehealth: Payer: Self-pay | Admitting: Cardiology

## 2024-01-07 NOTE — Telephone Encounter (Signed)
 Pt c/o medication issue:  1. Name of Medication:   lisinopril  (ZESTRIL ) 20 MG tablet  amLODipine  (NORVASC ) 5 MG tablet   2. How are you currently taking this medication (dosage and times per day)? As Written  3. Are you having a reaction (difficulty breathing--STAT)? No 4. What is your medication issue? Patient stated he has been on this medication for 2-3 months and has noticed that he has been having issues where his hand will itch (similar to having eczema). Patient stated last night after taking this medication, his mouth/tongue started to become itchy. Patient stated the itchiness moved from his mouth to his hands, then eventually to his feet. Please advise.

## 2024-01-08 NOTE — Telephone Encounter (Signed)
 Patient notified and verbalized understanding.   States that he took them about an hour apart last evening and did fine.  States he has been fine since.  If happens again, he will try holding the Lisinopril  first.  He does already have OV scheduled for 01/17/24 with Almarie Crate, NP at 8:30 am.

## 2024-01-17 ENCOUNTER — Ambulatory Visit: Attending: Nurse Practitioner | Admitting: Nurse Practitioner

## 2024-01-17 ENCOUNTER — Encounter: Payer: Self-pay | Admitting: Nurse Practitioner

## 2024-01-17 ENCOUNTER — Telehealth: Payer: Self-pay | Admitting: Nurse Practitioner

## 2024-01-17 VITALS — BP 116/70 | HR 73 | Ht 69.0 in | Wt 185.0 lb

## 2024-01-17 DIAGNOSIS — I10 Essential (primary) hypertension: Secondary | ICD-10-CM

## 2024-01-17 DIAGNOSIS — R0602 Shortness of breath: Secondary | ICD-10-CM | POA: Diagnosis not present

## 2024-01-17 DIAGNOSIS — I77819 Aortic ectasia, unspecified site: Secondary | ICD-10-CM | POA: Diagnosis not present

## 2024-01-17 DIAGNOSIS — E663 Overweight: Secondary | ICD-10-CM

## 2024-01-17 NOTE — Patient Instructions (Addendum)
Medication Instructions:  Your physician recommends that you continue on your current medications as directed. Please refer to the Current Medication list given to you today.   Labwork: None  Testing/Procedures: Your physician has requested that you have a lexiscan myoview. For further information please visit [email or web address] Please follow instruction sheet, as given.   Follow-Up: Your physician recommends that you schedule a follow-up appointment in: 3 months  Any Other Special Instructions Will Be Listed Below (If Applicable).  Thank you for choosing [facility]!      If you need a refill on your cardiac medications before your next appointment, please call your pharmacy.

## 2024-01-17 NOTE — Telephone Encounter (Signed)
 Checking percert on the following patient for testing scheduled at [facility].   LEXISCAN   01/21/2024

## 2024-01-17 NOTE — Progress Notes (Signed)
 Cardiology Office Note:  .   Date:  01/17/2024 ID:  Xavier Walsh, DOB [birth year], MRN [id number] PCP: Rosamond Leta NOVAK, MD  [facility] Providers Cardiologist:  Alvan Carrier, MD    History of Present Illness: .   Xavier Walsh is a 58 y.o. male with a PMH of hypertension, shortness of breath, and dizziness, who presents today for follow-up.  Last seen by Dr. Carrier Alvan on April 12, 2023.  Patient noted some shortness of breath with inclines, denied any chest pain.  BP was above goal in office, lisinopril  was increased to 20 mg daily, and he also reported some fatigue and dizziness since changing from Bystolic , metoprolol  was lowered to 25 mg daily.  Echocardiogram was benign.  05/07/2023 - Today he presents for 4-week follow-up.  He states he notes intermittent dizziness noticed with position changes in the early morning. Provides his BP log from home, with average SBP of 130's  - 140's. Taking Toprol  XL in AM and lisinopril  is taken in the PM. Denies any chest pain, shortness of breath, palpitations, syncope, presyncope, orthopnea, PND, swelling or significant weight changes, acute bleeding, or claudication.  07/16/2023 - Presents today for follow-up. Brings in his BP cuff from home with half of his readings at goal. Does admit to some nervousness, BP tends to run higher in office than at home. Does admit to nerve pain in his feet noted at night, has been ongoing for the past few weeks. Describes this as burning/itching sensation. Does admit to possible acid reflex/epigastric pain and right upper quadrant pain, says he can feel his pulse. Denies any chest pain, shortness of breath, syncope, presyncope, dizziness, orthopnea, PND, swelling or significant weight changes, acute bleeding, or claudication.  01/17/2024 - Here for follow-up. Does admit to some shortness of breath at times, denies any recent chest pain. Denies any palpitations, syncope, presyncope, dizziness, orthopnea, PND,  swelling or significant weight changes, acute bleeding, or claudication. No longer having any itching recently after adjusting the timing of taking his medications. He presents his vitals from home that show overall well controlled BP and HR readings. Plans to potentially have knee operation in the future.   ROS: Negative. See HPI.  FH: positive for T2DM, CVA, pulmonary fibrosis, and lung cancer. Wife is a [employer] fundraiser.  Studies Reviewed: SABRA    EKG: EKG is not ordered today.   Echo 03/2023:  1. Left ventricular ejection fraction, by estimation, is 70 to 75%. The  left ventricle has hyperdynamic function. The left ventricle has no  regional wall motion abnormalities. There is mild left ventricular  hypertrophy. Left ventricular diastolic  parameters were normal.   2. Right ventricular systolic function is normal. The right ventricular  size is normal.   3. The mitral valve is normal in structure. No evidence of mitral valve  regurgitation. No evidence of mitral stenosis.   4. The tricuspid valve is abnormal.   5. The aortic valve has an indeterminant number of cusps. Aortic valve  regurgitation is mild. No aortic stenosis is present.   6. Aortic dilatation noted. There is mild dilatation of the aortic root,  measuring 40 mm. There is mild dilatation of the ascending aorta,  measuring 40 mm.   7. IVC is small suggesting low RA pressure and hypovolemia.   Comparison(s): No prior study.     Physical Exam:   VS:  BP 116/70   Pulse 73   Ht 5' 9 (1.753 m)   Wt 185 lb (  83.9 kg)   SpO2 98%   BMI 27.32 kg/m    Wt Readings from Last 3 Encounters:  01/17/24 185 lb (83.9 kg)  09/25/23 185 lb (83.9 kg)  09/05/23 185 lb (83.9 kg)     GEN: Well nourished, well developed in no acute distress NECK: No JVD; No carotid bruits CARDIAC: S1/S2, RRR, no murmurs, rubs, gallops RESPIRATORY:  Clear to auscultation without rales, wheezing or rhonchi  ABDOMEN: Soft, non-tender, non-distended EXTREMITIES:   No edema; No deformity   ASSESSMENT AND PLAN: .    HTN SBP at home averaging 120s. SBP goal is less than 130.  Continue current medication regimen. Discussed to monitor BP at home at least 2 hours after medications and sitting for 5-10 minutes.  Recommended to follow-up with PCP regarding his medication management.  He verbalized understanding. Heart healthy diet and regular cardiovascular exercise encouraged.   2. Overweight Weight loss via diet and exercise encouraged. Discussed the impact being overweight would have on cardiovascular risk.  3. Shortness of breath Etiology unclear. Echo from last fall was benign and does not explain his symptoms.  I went over risk versus benefits of this with him and he verbalized understanding and is agreeable to proceed.  Will arrange Lexiscan for further evaluation. Cannot do treadmill test d/t his knee.   Informed Consent   Shared Decision Making/Informed Consent The risks [chest pain, shortness of breath, cardiac arrhythmias, dizziness, blood pressure fluctuations, myocardial infarction, stroke/transient ischemic attack, nausea, vomiting, allergic reaction, radiation exposure, metallic taste sensation and life-threatening complications (estimated to be 1 in 10,000)], benefits (risk stratification, diagnosing coronary artery disease, treatment guidance) and alternatives of a nuclear stress test were discussed in detail with Mr. Baumgardner and he agrees to proceed.     4.  Aortic dilatation  Echo from November 2024 showed mild dilatation of aortic root, measuring 40 mm along with mild dilatation of ascending aorta, measuring 40 mm. Plan to update imaging later this year.     Dispo: Follow-up with Dr. Dorn Ross or APP in 3 months or sooner if anything changes.  Signed, Almarie Crate, NP

## 2024-01-21 ENCOUNTER — Encounter (HOSPITAL_COMMUNITY)

## 2024-01-21 ENCOUNTER — Other Ambulatory Visit: Payer: Self-pay

## 2024-01-21 ENCOUNTER — Other Ambulatory Visit (HOSPITAL_COMMUNITY): Payer: Self-pay

## 2024-01-23 DIAGNOSIS — M25811 Other specified joint disorders, right shoulder: Secondary | ICD-10-CM | POA: Diagnosis not present

## 2024-01-31 ENCOUNTER — Other Ambulatory Visit (HOSPITAL_COMMUNITY): Payer: Self-pay

## 2024-01-31 ENCOUNTER — Other Ambulatory Visit: Payer: Self-pay | Admitting: Cardiology

## 2024-01-31 MED ORDER — LISINOPRIL 20 MG PO TABS
20.0000 mg | ORAL_TABLET | Freq: Every day | ORAL | 3 refills | Status: AC
  Filled 2024-01-31 – 2024-02-01 (×2): qty 90, 90d supply, fill #0
  Filled 2024-04-25: qty 90, 90d supply, fill #1

## 2024-02-01 ENCOUNTER — Other Ambulatory Visit (HOSPITAL_COMMUNITY): Payer: Self-pay

## 2024-02-27 ENCOUNTER — Encounter (INDEPENDENT_AMBULATORY_CARE_PROVIDER_SITE_OTHER): Payer: Self-pay | Admitting: Gastroenterology

## 2024-02-27 ENCOUNTER — Other Ambulatory Visit (HOSPITAL_COMMUNITY): Payer: Self-pay

## 2024-03-05 ENCOUNTER — Other Ambulatory Visit (HOSPITAL_COMMUNITY): Payer: Self-pay

## 2024-03-05 ENCOUNTER — Telehealth: Payer: Self-pay | Admitting: Cardiology

## 2024-03-05 NOTE — Telephone Encounter (Signed)
 Dr. Bard Sharps, Anesthesiologist wants to consult regarding patient's medication and noted patient is about to go in to surgery.

## 2024-03-05 NOTE — Telephone Encounter (Signed)
 Dr. Bard Sharps informed.

## 2024-03-05 NOTE — Telephone Encounter (Signed)
   Pre-operative Risk Assessment    Patient Name: Xavier Walsh  DOB: 08-24-1965 MRN: [id number]      Request for Surgical Clearance    Procedure:  Right Knee Surgery  Date of Surgery:  Clearance 03/05/24                                 Surgeon:  Dr. Bard Sharps Anesthesia/ Dr. Elspeth Her is orthopedic surgeon Surgeon's Group or Practice Name:  [facility] [place]/ Phone number:  [phone] Fax number:  [phone]   Type of Clearance Requested:   - Medical    Type of Anesthesia:  General    Additional requests/questions:  Patient is at surgery center now and need clearance for knee surgery. Dr. Bard Sharps said surgery time is 12:30 pm. Patient was scheduled for lexiscan and did not have this done.   Bonney Lenon Jinnie Merilee   03/05/2024, 11:49 AM

## 2024-03-06 ENCOUNTER — Telehealth: Payer: Self-pay | Admitting: Cardiology

## 2024-03-06 NOTE — Telephone Encounter (Signed)
 I will reach out to Dr. Alvan nurse Jinnie A. RN in regard to if stress test is being rescheduled. As notes from Dr. Alvan that he needs cardiac testing before he can be cleared. Pt has appt in office with Almarie Crate, NP 04/17/24.   Will reach to RN if their office is rescheduling stress test.

## 2024-03-06 NOTE — Telephone Encounter (Signed)
 Checking percert on the following patient for testing scheduled at [facility].    LEXISCAN   03/12/24

## 2024-03-06 NOTE — Telephone Encounter (Signed)
 I will forward to the preop APP to review notes as well and if any further notes needed.

## 2024-03-12 ENCOUNTER — Ambulatory Visit (HOSPITAL_COMMUNITY)
Admission: RE | Admit: 2024-03-12 | Discharge: 2024-03-12 | Disposition: A | Discharge status: Home / Self Care | Source: Ambulatory Visit | Attending: Nurse Practitioner | Admitting: Nurse Practitioner

## 2024-03-12 ENCOUNTER — Telehealth: Payer: Self-pay | Admitting: Student

## 2024-03-12 ENCOUNTER — Ambulatory Visit (HOSPITAL_BASED_OUTPATIENT_CLINIC_OR_DEPARTMENT_OTHER)
Admission: RE | Admit: 2024-03-12 | Discharge: 2024-03-12 | Disposition: A | Discharge status: Home / Self Care | Source: Ambulatory Visit | Attending: Nurse Practitioner | Admitting: Nurse Practitioner

## 2024-03-12 ENCOUNTER — Encounter (HOSPITAL_COMMUNITY): Payer: Self-pay

## 2024-03-12 DIAGNOSIS — R0602 Shortness of breath: Secondary | ICD-10-CM | POA: Diagnosis not present

## 2024-03-12 LAB — NM MYOCAR MULTI W/SPECT W/WALL MOTION / EF
Estimated workload: 1
Exercise duration (min): 0 min
Exercise duration (sec): 0 s
LV dias vol: 108 mL (ref 62–150)
LV sys vol: 40 mL (ref 4.2–5.8)
MPHR: 161 {beats}/min
Nuc Stress EF: 64 %
Peak HR: 133 {beats}/min
Percent HR: 82 %
RATE: 0.4
Rest HR: 72 {beats}/min
Rest Nuclear Isotope Dose: 10.7 mCi
SDS: 3
SRS: 0
SSS: 3
Stress Nuclear Isotope Dose: 31.6 mCi
TID: 1.15

## 2024-03-12 MED ORDER — SODIUM CHLORIDE FLUSH 0.9 % IV SOLN
INTRAVENOUS | Status: AC
  Administered 2024-03-12: 10 mL via INTRAVENOUS
  Filled 2024-03-12: qty 10

## 2024-03-12 MED ORDER — TECHNETIUM TC 99M TETROFOSMIN IV KIT
30.0000 | PACK | Freq: Once | INTRAVENOUS | Status: AC | PRN
  Administered 2024-03-12: 31.6 via INTRAVENOUS

## 2024-03-12 MED ORDER — REGADENOSON 0.4 MG/5ML IV SOLN
INTRAVENOUS | Status: AC
  Administered 2024-03-12: 0.4 mg via INTRAVENOUS
  Filled 2024-03-12: qty 5

## 2024-03-12 MED ORDER — TECHNETIUM TC 99M TETROFOSMIN IV KIT
10.0000 | PACK | Freq: Once | INTRAVENOUS | Status: AC | PRN
  Administered 2024-03-12: 10.7 via INTRAVENOUS

## 2024-03-12 NOTE — Telephone Encounter (Signed)
     Xavier CHRISTELLA Walsh presented for a Lexiscan nuclear stress test today.  I Xavier CHRISTELLA Qua, PA-C, provided direct supervision and was present during the stress portion of the study today, which was completed without significant symptoms, immediate complications, or acute ST/T changes on ECG.  Stress imaging is pending at this time.  Preliminary ECG findings may be listed in the chart, but the stress test result will not be finalized until perfusion imaging is complete.  Xavier CHRISTELLA Qua, PA-C  03/12/2024, 10:50 AM

## 2024-03-18 ENCOUNTER — Telehealth: Payer: Self-pay | Admitting: Cardiology

## 2024-03-18 ENCOUNTER — Ambulatory Visit: Payer: Self-pay | Admitting: Nurse Practitioner

## 2024-03-18 NOTE — Telephone Encounter (Signed)
-----   Message from Almarie Crate sent at 03/18/2024 12:11 PM EST ----- Stress test reviewed. Does show findings consistent with past heart attack and mild area of poor blood flow, study was determined to be low risk. Heart pumping function was normal during test. I will  go over this with him and discuss more in detail at next office visit.   Thanks!  Almarie Crate, AGNP-C ----- Message ----- From: Alvan Dorn FALCON, MD Sent: 03/12/2024   5:25 PM EST To: Almarie Crate, NP

## 2024-03-18 NOTE — Telephone Encounter (Signed)
 Pt is requesting a callback regarding results. He stated he hadn't heard anything and was concerned. Please advise

## 2024-03-18 NOTE — Telephone Encounter (Signed)
 Patient notified of result.  Please refer to phone note from today for complete details.   Littie CHRISTELLA Croak, CMA 03/18/2024 12:55 PM

## 2024-03-18 NOTE — Telephone Encounter (Signed)
 The patient has been notified of the result and verbalized understanding.  All questions (if any) were answered. Littie CHRISTELLA Croak, CMA 03/18/2024 12:54 PM

## 2024-03-18 NOTE — Telephone Encounter (Signed)
 Left message to call back.

## 2024-03-19 NOTE — Telephone Encounter (Signed)
   Name: Xavier Walsh  DOB: 12-29-1965  MRN: [id number]  Primary Cardiologist: Alvan Carrier, MD  Chart reviewed as part of pre-operative protocol coverage. The patient has an upcoming visit scheduled with  Almarie Crate, NP on 04/17/2024 at which time clearance can be addressed in case there are any issues that would impact surgical recommendations.  Right knee surgery Is not scheduled (awaiting test results) as below. I added preop FYI to appointment note so that provider is aware to address at time of outpatient visit.  Per office protocol the cardiology provider should forward their finalized clearance decision and recommendations regarding antiplatelet therapy to the requesting party below.    I will route this message as FYI to requesting party and remove this message from the preop box as separate preop APP input not needed at this time.   Please call with any questions.  Lamarr Satterfield, NP  03/19/2024, 2:36 PM

## 2024-03-19 NOTE — Telephone Encounter (Signed)
 Patient has upcoming appointment clearance can be addressed at office visit and note has been made to appointment line that clearance is needed

## 2024-03-23 ENCOUNTER — Other Ambulatory Visit (INDEPENDENT_AMBULATORY_CARE_PROVIDER_SITE_OTHER): Payer: Self-pay | Admitting: Gastroenterology

## 2024-03-23 ENCOUNTER — Other Ambulatory Visit (HOSPITAL_COMMUNITY): Payer: Self-pay

## 2024-03-24 ENCOUNTER — Other Ambulatory Visit (HOSPITAL_COMMUNITY): Payer: Self-pay

## 2024-03-24 MED ORDER — OMEPRAZOLE 20 MG PO CPDR
20.0000 mg | DELAYED_RELEASE_CAPSULE | Freq: Every day | ORAL | 2 refills | Status: AC
  Filled 2024-03-24 – 2024-03-25 (×2): qty 30, 30d supply, fill #0
  Filled 2024-04-25: qty 30, 30d supply, fill #1
  Filled 2024-06-01 – 2024-06-03 (×3): qty 30, 30d supply, fill #2

## 2024-03-25 ENCOUNTER — Other Ambulatory Visit (HOSPITAL_COMMUNITY): Payer: Self-pay

## 2024-04-05 ENCOUNTER — Other Ambulatory Visit (HOSPITAL_COMMUNITY): Payer: Self-pay

## 2024-04-08 ENCOUNTER — Other Ambulatory Visit (HOSPITAL_COMMUNITY): Payer: Self-pay

## 2024-04-16 ENCOUNTER — Other Ambulatory Visit (HOSPITAL_COMMUNITY): Payer: Self-pay

## 2024-04-17 ENCOUNTER — Other Ambulatory Visit (HOSPITAL_COMMUNITY): Payer: Self-pay

## 2024-04-17 ENCOUNTER — Encounter: Payer: Self-pay | Admitting: Nurse Practitioner

## 2024-04-17 ENCOUNTER — Ambulatory Visit: Attending: Nurse Practitioner | Admitting: Nurse Practitioner

## 2024-04-17 ENCOUNTER — Other Ambulatory Visit: Payer: Self-pay

## 2024-04-17 VITALS — BP 150/92 | HR 90 | Ht 69.0 in | Wt 210.8 lb

## 2024-04-17 DIAGNOSIS — I10 Essential (primary) hypertension: Secondary | ICD-10-CM

## 2024-04-17 DIAGNOSIS — R0609 Other forms of dyspnea: Secondary | ICD-10-CM

## 2024-04-17 DIAGNOSIS — E669 Obesity, unspecified: Secondary | ICD-10-CM | POA: Diagnosis not present

## 2024-04-17 DIAGNOSIS — F419 Anxiety disorder, unspecified: Secondary | ICD-10-CM | POA: Diagnosis not present

## 2024-04-17 DIAGNOSIS — R079 Chest pain, unspecified: Secondary | ICD-10-CM | POA: Diagnosis not present

## 2024-04-17 DIAGNOSIS — I77819 Aortic ectasia, unspecified site: Secondary | ICD-10-CM | POA: Diagnosis not present

## 2024-04-17 MED ORDER — NITROGLYCERIN 0.4 MG SL SUBL
0.4000 mg | SUBLINGUAL_TABLET | SUBLINGUAL | 3 refills | Status: AC | PRN
  Filled 2024-04-17: qty 25, 8d supply, fill #0

## 2024-04-17 NOTE — Patient Instructions (Signed)
 Medication Instructions:  Your physician has recommended you make the following change in your medication:   Start nitroglycerin as needed for chest pain   Monitor Blood Pressure for 2-3 weeks   *If you need a refill on your cardiac medications before your next appointment, please call your pharmacy*  Lab Work: NONE   If you have labs (blood work) drawn today and your tests are completely normal, you will receive your results only by: MyChart Message (if you have MyChart) OR A paper copy in the mail If you have any lab test that is abnormal or we need to change your treatment, we will call you to review the results.  Testing/Procedures: NONE   Follow-Up: At [facility], you and your health needs are our priority.  As part of our continuing mission to provide you with exceptional heart care, our providers are all part of one team.  This team includes your primary Cardiologist (physician) and Advanced Practice Providers or APPs (Physician Assistants and Nurse Practitioners) who all work together to provide you with the care you need, when you need it.  Your next appointment:   4 -6 week(s)  Provider:   Almarie Crate, NP    We recommend signing up for the patient portal called MyChart.  Sign up information is provided on this After Visit Summary.  MyChart is used to connect with patients for Virtual Visits (Telemedicine).  Patients are able to view lab/test results, encounter notes, upcoming appointments, etc.  Non-urgent messages can be sent to your provider as well.   To learn more about what you can do with MyChart, go to [email or web address].   Other Instructions Thank you for choosing [facility]!

## 2024-04-17 NOTE — Progress Notes (Addendum)
 Cardiology Office Note:  .   Date:  04/17/2024 ID:  Xavier Walsh, DOB 02/21/1966, MRN [id number] PCP: Xavier Leta NOVAK, MD  [facility] Providers Cardiologist:  Alvan Carrier, MD    History of Present Illness: .   Xavier Walsh is a 58 y.o. male with a PMH of hypertension, shortness of breath, and dizziness, who presents today for follow-up and for pre-op clearance.   Last seen by Dr. Carrier Alvan on April 12, 2023.  Patient noted some shortness of breath with inclines, denied any chest pain.  BP was above goal in office, lisinopril  was increased to 20 mg daily, and he also reported some fatigue and dizziness since changing from Bystolic , metoprolol  was lowered to 25 mg daily.  Echocardiogram was benign.  05/07/2023 - Today he presents for 4-week follow-up.  He states he notes intermittent dizziness noticed with position changes in the early morning. Provides his BP log from home, with average SBP of 130's  - 140's. Taking Toprol  XL in AM and lisinopril  is taken in the PM. Denies any chest pain, shortness of breath, palpitations, syncope, presyncope, orthopnea, PND, swelling or significant weight changes, acute bleeding, or claudication.  07/16/2023 - Presents today for follow-up. Brings in his BP cuff from home with half of his readings at goal. Does admit to some nervousness, BP tends to run higher in office than at home. Does admit to nerve pain in his feet noted at night, has been ongoing for the past few weeks. Describes this as burning/itching sensation. Does admit to possible acid reflex/epigastric pain and right upper quadrant pain, says he can feel his pulse. Denies any chest pain, shortness of breath, syncope, presyncope, dizziness, orthopnea, PND, swelling or significant weight changes, acute bleeding, or claudication.  01/17/2024 - Here for follow-up. Does admit to some shortness of breath at times, denies any recent chest pain. Denies any palpitations, syncope, presyncope,  dizziness, orthopnea, PND, swelling or significant weight changes, acute bleeding, or claudication. No longer having any itching recently after adjusting the timing of taking his medications. He presents his vitals from home that show overall well controlled BP and HR readings. Plans to potentially have knee operation in the future.   04/17/2024 - Our office received clearance for surgery in October 2025 for right knee surgery. Was recommended to proceed with stress testing prior to knee surgery. 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  There was ST depression along inferior lateral leads during stress portion and occurring while tachycardic, felt to be possibly rate related.  He is here for follow-up and for preop clearance.  He states he has sharp chest pains, also noticing dyspnea on exertion and has been ongoing over the past year, but more progressive recently. Used to run hills and be active and now not as much. Now noticing shortness of breath with exertion while completing tasks. Chest pain is not always exertional, says he could be sitting and noticing a sharp chest pain. Does admit to anxiety and hx of panic attacks, says he had a hard time completing the NST and laying still. Says he has a general sense of not feeling quite right. Denies any palpitations, syncope, presyncope, dizziness, orthopnea, PND, swelling or significant weight changes, acute bleeding, or claudication. Does have hx of second hand tobacco exposure.   ROS: Negative. See HPI.  FH: positive for T2DM, CVA, pulmonary fibrosis, and lung cancer. Wife is a  RN.  Studies Reviewed: SABRA    EKG:  EKG Interpretation Date/Time:  Thursday April 17 2024 08:25:05 EST Ventricular Rate:  90 PR Interval:  168 QRS Duration:  78 QT Interval:  326 QTC Calculation: 398 R Axis:   47  Text Interpretation: Normal sinus rhythm Nonspecific ST  abnormality When compared with ECG of 04-Apr-2023 14:57, No significant change was found Confirmed by Xavier Walsh [phone]) on 04/17/2024 8:28:20 AM   Lexiscan  02/2024:    Findings are consistent with prior inferior infarction with mild peri-infarct ischemia. The study is low risk.   ST depression along inferior and lateral leads during stress portion, occuring while tachycardiac and possibly rate-related.   LV perfusion is abnormal. Moderate size mild intensity inferior wall defect with mild reversibility.   Nuclear stress EF: 64%. The left ventricular ejection fraction is normal (55-65%). End diastolic cavity size is normal.  Echo 03/2023:  1. Left ventricular ejection fraction, by estimation, is 70 to 75%. The  left ventricle has hyperdynamic function. The left ventricle has no  regional wall motion abnormalities. There is mild left ventricular  hypertrophy. Left ventricular diastolic  parameters were normal.   2. Right ventricular systolic function is normal. The right ventricular  size is normal.   3. The mitral valve is normal in structure. No evidence of mitral valve  regurgitation. No evidence of mitral stenosis.   4. The tricuspid valve is abnormal.   5. The aortic valve has an indeterminant number of cusps. Aortic valve  regurgitation is mild. No aortic stenosis is present.   6. Aortic dilatation noted. There is mild dilatation of the aortic root,  measuring 40 mm. There is mild dilatation of the ascending aorta,  measuring 40 mm.   7. IVC is small suggesting low RA pressure and hypovolemia.   Comparison(s): No prior study.     Physical Exam:   VS:  BP (!) 150/92   Pulse 90   Ht 5' 9 (1.753 m)   Wt 210 lb 12.8 oz (95.6 kg)   SpO2 96%   BMI 31.13 kg/m    Wt Readings from Last 3 Encounters:  04/17/24 210 lb 12.8 oz (95.6 kg)  01/17/24 185 lb (83.9 kg)  09/25/23 185 lb (83.9 kg)     GEN: Well nourished, well developed in no acute distress NECK: No JVD; No carotid  bruits CARDIAC: S1/S2, RRR, no murmurs, rubs, gallops RESPIRATORY:  Clear to auscultation without rales, wheezing or rhonchi  ABDOMEN: Soft, non-tender, non-distended EXTREMITIES:  No edema; No deformity   ASSESSMENT AND PLAN: .    Chest pain of uncertain etiology, DOE Hx of chest pain, DOE. DOE has become more progressive, past Echo benign. Reviewed recent Lexiscan  with pt and pt does have some risk factors for CAD including age, gender, hypertension, elevated BMI and hx of tobacco exposure. Did discuss options and did discuss risks vs benefits of left heart catheterization for definitive evaluation.  Ran case past Dr. Alvan who agreed with left heart cath.  Patient verbalized understanding risk versus benefits and is agreeable to proceed.  Will arrange.  Will obtain CBC and BMET. Will write Rx for NTG PRN for chest pain.  I instructed him about this medication and he verbalized understanding. Orders are under signed and held.   Informed Consent   Shared Decision Making/Informed Consent The risks [chest pain, shortness of breath, cardiac arrhythmias, dizziness, blood pressure fluctuations, myocardial infarction, stroke/transient ischemic attack, nausea, vomiting, allergic reaction, radiation exposure, metallic taste sensation and  life-threatening complications (estimated to be 1 in 10,000)], benefits (risk stratification, diagnosing coronary artery disease, treatment guidance) and alternatives of a nuclear stress test were discussed in detail with Mr. Garciamartinez and he agrees to proceed.     HTN SBP at home averaging 120s. SBP goal is less than 130.  BP elevated and believe anxiety is contributing to this. Continue current medication regimen. Discussed to monitor BP at home at least 2 hours after medications and sitting for 5-10 minutes.  His wife is a [employer] and I encouraged him to monitor and log his BP and report his readings to us  in 2-3 weeks. Heart healthy diet and regular cardiovascular exercise  encouraged.   3. Obesity Weight loss via diet and exercise encouraged. Discussed the impact being overweight would have on cardiovascular risk.  4.  Aortic dilatation  Echo from November 2024 showed mild dilatation of aortic root, measuring 40 mm along with mild dilatation of ascending aorta, measuring 40 mm. Plan to update imaging at next office visit.  5. Anxiety Recommended to follow-up with PCP for evaluation and management.   Dispo: Care and ED precautions discussed.  Follow-up with Dr. Dorn Ross or APP in 4-6 weeks or sooner if anything changes.  Signed, Almarie Crate, NP

## 2024-04-18 ENCOUNTER — Encounter: Payer: Self-pay | Admitting: *Deleted

## 2024-04-18 ENCOUNTER — Other Ambulatory Visit (HOSPITAL_COMMUNITY): Payer: Self-pay

## 2024-04-18 NOTE — Addendum Note (Signed)
 Addended by: Larance Ratledge G on: 04/18/2024 11:12 AM   Modules accepted: Orders

## 2024-04-18 NOTE — Addendum Note (Signed)
 Addended by: MIRIAM NORRIS on: 04/18/2024 01:56 PM   Modules accepted: Orders

## 2024-04-21 NOTE — Progress Notes (Signed)
 Patients are usually given IV Versed  and Fentanyl at the hospital prior to cath.

## 2024-04-22 ENCOUNTER — Telehealth: Payer: Self-pay | Admitting: Nurse Practitioner

## 2024-04-22 ENCOUNTER — Other Ambulatory Visit (HOSPITAL_COMMUNITY)
Admission: RE | Admit: 2024-04-22 | Discharge: 2024-04-22 | Disposition: A | Source: Ambulatory Visit | Attending: Nurse Practitioner | Admitting: Nurse Practitioner

## 2024-04-22 ENCOUNTER — Ambulatory Visit: Payer: Self-pay | Admitting: Nurse Practitioner

## 2024-04-22 ENCOUNTER — Encounter: Payer: Self-pay | Admitting: *Deleted

## 2024-04-22 DIAGNOSIS — R079 Chest pain, unspecified: Secondary | ICD-10-CM

## 2024-04-22 DIAGNOSIS — R0609 Other forms of dyspnea: Secondary | ICD-10-CM

## 2024-04-22 LAB — BASIC METABOLIC PANEL WITH GFR
Anion gap: 7 (ref 5–15)
BUN: 17 mg/dL (ref 6–20)
CO2: 30 mmol/L (ref 22–32)
Calcium: 9.3 mg/dL (ref 8.9–10.3)
Chloride: 101 mmol/L (ref 98–111)
Creatinine, Ser: 0.92 mg/dL (ref 0.61–1.24)
GFR, Estimated: >60 mL/min (ref >60)
Glucose, Bld: 107 mg/dL — ABNORMAL HIGH (ref 70–99)
Potassium: 4 mmol/L (ref 3.5–5.1)
Sodium: 138 mmol/L (ref 135–145)

## 2024-04-22 LAB — CBC
HCT: 44.4 % (ref 39.0–52.0)
Hemoglobin: 15.3 g/dL (ref 13.0–17.0)
MCH: 31.2 pg (ref 26.0–34.0)
MCHC: 34.5 g/dL (ref 30.0–36.0)
MCV: 90.6 fL (ref 80.0–100.0)
Platelets: 299 K/uL (ref 150–400)
RBC: 4.9 MIL/uL (ref 4.22–5.81)
RDW: 13.2 % (ref 11.5–15.5)
WBC: 6.7 K/uL (ref 4.0–10.5)
nRBC: 0 % (ref 0.0–0.2)

## 2024-04-22 NOTE — Telephone Encounter (Signed)
 Heart Cath instructions reviewed with patient and made available in mychart. Verbalized understanding of plan.  [facility] A DEPT OF [place]. [facility] [address] A DEPT OF THE . [facility] [address] [place] [address] Dept: [phone] Loc: [phone]  Xavier Walsh  04/22/2024  You are scheduled for a Cardiac Catheterization on Wednesday, December 17 with Dr. Alm Clay.  1. Please arrive at the [facility] (Main Entrance A) at [facility]: [address] [place], [place] [address] at 9:30 AM (This time is 2 hour(s) before your procedure to ensure your preparation).   Free valet parking service is available. You will check in at ADMITTING. The support person will be asked to wait in the waiting room.  It is OK to have someone drop you off and come back when you are ready to be discharged.    Special note: Every effort is made to have your procedure done on time. Please understand that emergencies sometimes delay scheduled procedures.  2. Diet: Nothing to eat after midnight.   3. Hydration: You need to be well hydrated before your procedure. On December 17, you may drink approved liquids (see below) until 2 hours before the procedure, with 16 oz of water  as your last intake.   List of approved liquids water , clear juice, clear tea, black coffee, fruit juices, non-citric and without pulp, carbonated beverages, Gatorade, Kool -Aid, plain Jello-O and plain ice popsicles.  4. Labs: You will need to have blood drawn on Wednesday, December 10 at [facility].  You do not need to be fasting.  5. Medication instructions in preparation for your procedure: You can take your medications on the morning of your test.   Contrast Allergy: No  On the morning of your procedure, take your Aspirin 81 mg and any morning medicines NOT listed above.  You may use sips of water .  6. Plan to go home the same day, you will only stay  overnight if medically necessary. 7. Bring a current list of your medications and current insurance cards. 8. You MUST have a responsible person to drive you home. 9. Someone MUST be with you the first 24 hours after you arrive home or your discharge will be delayed. 10. Please wear clothes that are easy to get on and off and wear slip-on shoes.  Thank you for allowing us  to care for you!   -- [facility]

## 2024-04-22 NOTE — Telephone Encounter (Signed)
 Checking percert on the following patient for   Left Heart Cath and Coronary Angiography  04/30/2024

## 2024-04-22 NOTE — Telephone Encounter (Signed)
 Pt called in asking to speak to someone about his cath. Please advise.

## 2024-04-25 ENCOUNTER — Other Ambulatory Visit (HOSPITAL_COMMUNITY): Payer: Self-pay

## 2024-04-28 ENCOUNTER — Telehealth: Payer: Self-pay | Admitting: *Deleted

## 2024-04-28 NOTE — Telephone Encounter (Signed)
 Cardiac Catheterization scheduled at [facility] for: Wednesday April 30, 2024 11:30 AM Arrival time [facility] Main Entrance A at: 9:30 AM  Diet: -Nothing to eat after midnight.  Hydration: -May drink clear liquids until 2 hours before the procedure.  Approved liquids: Water , clear tea, black coffee, fruit juices-non-citric and without pulp,Gatorade, plain Jello/popsicles.   -Please drink 16 oz of water  2 hours before procedure.  Medication instructions: -Usual morning medications can be taken including aspirin  81 mg.  Plan to go home the same day, you will only stay overnight if medically necessary.  You must have responsible adult to drive you home.  Someone must be with you the first 24 hours after you arrive home.  Reviewed procedure instructions with patient.

## 2024-04-30 ENCOUNTER — Other Ambulatory Visit: Payer: Self-pay

## 2024-04-30 ENCOUNTER — Ambulatory Visit (HOSPITAL_COMMUNITY)
Admission: RE | Admit: 2024-04-30 | Discharge: 2024-04-30 | Disposition: A | Attending: Cardiology | Admitting: Cardiology

## 2024-04-30 ENCOUNTER — Encounter (HOSPITAL_COMMUNITY): Admission: RE | Disposition: A | Payer: Self-pay | Attending: Cardiology

## 2024-04-30 DIAGNOSIS — R079 Chest pain, unspecified: Secondary | ICD-10-CM

## 2024-04-30 DIAGNOSIS — R0609 Other forms of dyspnea: Secondary | ICD-10-CM

## 2024-04-30 SURGERY — LEFT HEART CATH AND CORONARY ANGIOGRAPHY
Anesthesia: LOCAL

## 2024-04-30 MED ORDER — SODIUM CHLORIDE 0.9% FLUSH: 3.0000 mL | Freq: Two times a day (BID) | INTRAVENOUS | Status: DC

## 2024-04-30 MED ORDER — FREE WATER: 500.0000 mL | Freq: Once | Status: DC

## 2024-04-30 MED ORDER — SODIUM CHLORIDE 0.9% FLUSH: 3.0000 mL | INTRAVENOUS | Status: DC | PRN

## 2024-04-30 MED ORDER — SODIUM CHLORIDE 0.9 % IV SOLN: 250.0000 mL | INTRAVENOUS | Status: DC | PRN

## 2024-04-30 MED ORDER — ASPIRIN 81 MG PO CHEW: 81.0000 mg | CHEWABLE_TABLET | ORAL | Status: DC

## 2024-04-30 NOTE — H&P (View-Only) (Signed)
° ° °  Brief Cardiac Catheterization Lab Progress Note  Patient presented for cardiac catheterization.  Unfortunately there was a slight delay for possible STEMI.  The patient had significant anxiety and was extremely anxious.  Had to stand up.  In the past she has had a be treated with Precedex for colonoscopies due to his level of anxiety.  After prolonged discussion about the risks and benefits of proceeding with a catheterization procedure, we decided it was probably not wise or safe to proceed with catheterization today.  We will try just to have him posted for a first case procedure in January 2026 with plans to either premedicate with Valium  prior to leaving home versus upon arrival to the short stay.  Since I have already met him and talk to him, informed consent will be based on simple discussion with the patient's wife to ensure that he has been doing okay and we should be fine to proceed.    Alm Clay, MD

## 2024-04-30 NOTE — H&P (Signed)
° ° °  Brief Cardiac Catheterization Lab Progress Note  Patient presented for cardiac catheterization.  Unfortunately there was a slight delay for possible STEMI.  The patient had significant anxiety and was extremely anxious.  Had to stand up.  In the past she has had a be treated with Precedex for colonoscopies due to his level of anxiety.  After prolonged discussion about the risks and benefits of proceeding with a catheterization procedure, we decided it was probably not wise or safe to proceed with catheterization today.  We will try just to have him posted for a first case procedure in January 2026 with plans to either premedicate with Valium  prior to leaving home versus upon arrival to the short stay.  Since I have already met him and talk to him, informed consent will be based on simple discussion with the patient's wife to ensure that he has been doing okay and we should be fine to proceed.    Alm Clay, MD

## 2024-05-09 ENCOUNTER — Other Ambulatory Visit (HOSPITAL_COMMUNITY): Payer: Self-pay

## 2024-05-09 ENCOUNTER — Other Ambulatory Visit (HOSPITAL_BASED_OUTPATIENT_CLINIC_OR_DEPARTMENT_OTHER): Payer: Self-pay

## 2024-05-09 MED ORDER — ESCITALOPRAM OXALATE 10 MG PO TABS
10.0000 mg | ORAL_TABLET | Freq: Every day | ORAL | 4 refills | Status: AC
  Filled 2024-05-09: qty 30, 30d supply, fill #0
  Filled 2024-06-01: qty 30, 30d supply, fill #1
  Filled 2024-06-02: qty 90, 90d supply, fill #1
  Filled 2024-06-03: qty 30, 30d supply, fill #1

## 2024-05-12 ENCOUNTER — Other Ambulatory Visit (HOSPITAL_COMMUNITY): Payer: Self-pay

## 2024-05-22 ENCOUNTER — Other Ambulatory Visit (HOSPITAL_BASED_OUTPATIENT_CLINIC_OR_DEPARTMENT_OTHER): Payer: Self-pay

## 2024-05-22 ENCOUNTER — Other Ambulatory Visit (HOSPITAL_COMMUNITY)
Admission: RE | Admit: 2024-05-22 | Discharge: 2024-05-22 | Disposition: A | Discharge status: Home / Self Care | Source: Ambulatory Visit | Attending: Cardiology | Admitting: Cardiology

## 2024-05-22 ENCOUNTER — Telehealth: Payer: Self-pay | Admitting: *Deleted

## 2024-05-22 DIAGNOSIS — R079 Chest pain, unspecified: Secondary | ICD-10-CM | POA: Diagnosis present

## 2024-05-22 DIAGNOSIS — Z01812 Encounter for preprocedural laboratory examination: Secondary | ICD-10-CM | POA: Insufficient documentation

## 2024-05-22 LAB — CBC
HCT: 42.7 % (ref 39.0–52.0)
Hemoglobin: 14.6 g/dL (ref 13.0–17.0)
MCH: 30.7 pg (ref 26.0–34.0)
MCHC: 34.2 g/dL (ref 30.0–36.0)
MCV: 89.9 fL (ref 80.0–100.0)
Platelets: 358 K/uL (ref 150–400)
RBC: 4.75 MIL/uL (ref 4.22–5.81)
RDW: 13.1 % (ref 11.5–15.5)
WBC: 6.9 K/uL (ref 4.0–10.5)
nRBC: 0 % (ref 0.0–0.2)

## 2024-05-22 LAB — BASIC METABOLIC PANEL WITH GFR
Anion gap: 14 (ref 5–15)
BUN: 18 mg/dL (ref 6–20)
CO2: 21 mmol/L — ABNORMAL LOW (ref 22–32)
Calcium: 9.1 mg/dL (ref 8.9–10.3)
Chloride: 103 mmol/L (ref 98–111)
Creatinine, Ser: 0.85 mg/dL (ref 0.61–1.24)
GFR, Estimated: >60 mL/min
Glucose, Bld: 101 mg/dL — ABNORMAL HIGH (ref 70–99)
Potassium: 4 mmol/L (ref 3.5–5.1)
Sodium: 138 mmol/L (ref 135–145)

## 2024-05-22 MED ORDER — DIAZEPAM 5 MG PO TABS
5.0000 mg | ORAL_TABLET | ORAL | 0 refills | Status: DC
  Filled 2024-05-22: qty 1, 1d supply, fill #0

## 2024-05-22 MED ORDER — DIAZEPAM 5 MG PO TABS: ORAL_TABLET | ORAL | 0 refills | Status: DC

## 2024-05-22 NOTE — Telephone Encounter (Signed)
 error

## 2024-05-22 NOTE — Addendum Note (Signed)
 Addended by: Lariyah Shetterly M on: 05/22/2024 11:41 AM   Modules accepted: Orders

## 2024-05-22 NOTE — Telephone Encounter (Addendum)
 Cardiac Catheterization scheduled at [facility] for: Tuesday May 27, 2024 9 AM Arrival time [facility] Main Entrance A at: 7 AM  Diet: -Nothing to eat after midnight.  Hydration: -May drink clear liquids until 2 hours before the procedure.  Approved liquids: Water , clear tea, black coffee, fruit juices-non-citric and without pulp,Gatorade, plain Jello/popsicles.   -Please drink 16 oz of water  2 hours before procedure.  Medication instructions: -Usual morning medications can be taken including aspirin  81 mg.  Plan to go home the same day, you will only stay overnight if medically necessary.  You must have responsible adult to drive you home.  Someone must be with you the first 24 hours after you arrive home.  Reviewed procedure instructions with patient.  Patient advised per Dr Juna Clay will prescribe Valium  5 mg po for patient to take prior to leaving home the morning of the procedure. Patient advised not to drive after taking Valium . Prescription was verbally given to Gordy Blanch at [facility].  Patient also requested medication at hospital prior to cardiac cath. Per Dr Prince  2 mg PO prn x 1 on arrival to Short Stay prior to cardiac cath 05/27/24.

## 2024-05-22 NOTE — Addendum Note (Signed)
 Addended by: Carlen Rebuck M on: 05/22/2024 11:59 AM   Modules accepted: Orders

## 2024-05-23 ENCOUNTER — Ambulatory Visit: Payer: Self-pay | Admitting: Cardiology

## 2024-05-27 ENCOUNTER — Encounter (HOSPITAL_COMMUNITY)
Admission: RE | Disposition: A | Discharge status: Home / Self Care | Payer: Self-pay | Source: Home / Self Care | Attending: Cardiology

## 2024-05-27 ENCOUNTER — Ambulatory Visit (HOSPITAL_COMMUNITY)
Admission: RE | Admit: 2024-05-27 | Discharge: 2024-05-27 | Disposition: A | Discharge status: Home / Self Care | Attending: Cardiology | Admitting: Cardiology

## 2024-05-27 ENCOUNTER — Encounter (HOSPITAL_COMMUNITY): Payer: Self-pay | Admitting: Cardiology

## 2024-05-27 DIAGNOSIS — I25119 Atherosclerotic heart disease of native coronary artery with unspecified angina pectoris: Secondary | ICD-10-CM | POA: Diagnosis not present

## 2024-05-27 DIAGNOSIS — R9439 Abnormal result of other cardiovascular function study: Secondary | ICD-10-CM | POA: Diagnosis present

## 2024-05-27 DIAGNOSIS — I10 Essential (primary) hypertension: Secondary | ICD-10-CM | POA: Insufficient documentation

## 2024-05-27 DIAGNOSIS — Z7722 Contact with and (suspected) exposure to environmental tobacco smoke (acute) (chronic): Secondary | ICD-10-CM | POA: Diagnosis not present

## 2024-05-27 DIAGNOSIS — Z79899 Other long term (current) drug therapy: Secondary | ICD-10-CM | POA: Insufficient documentation

## 2024-05-27 DIAGNOSIS — R0602 Shortness of breath: Secondary | ICD-10-CM | POA: Diagnosis not present

## 2024-05-27 DIAGNOSIS — R9430 Abnormal result of cardiovascular function study, unspecified: Secondary | ICD-10-CM

## 2024-05-27 HISTORY — PX: LEFT HEART CATH AND CORONARY ANGIOGRAPHY: CATH118249

## 2024-05-27 MED ORDER — FREE WATER: 500.0000 mL | Freq: Once | Status: DC

## 2024-05-27 MED ORDER — LIDOCAINE HCL (PF) 1 % IJ SOLN
INTRAMUSCULAR | Status: DC | PRN
  Administered 2024-05-27: 2 mL

## 2024-05-27 MED ORDER — DIAZEPAM 2 MG PO TABS
2.0000 mg | ORAL_TABLET | Freq: Once | ORAL | Status: AC
  Administered 2024-05-27: 2 mg via ORAL
  Filled 2024-05-27: qty 1

## 2024-05-27 MED ORDER — SODIUM CHLORIDE 0.9% FLUSH: 3.0000 mL | INTRAVENOUS | Status: DC | PRN

## 2024-05-27 MED ORDER — SODIUM CHLORIDE 0.9% FLUSH: 3.0000 mL | Freq: Two times a day (BID) | INTRAVENOUS | Status: DC

## 2024-05-27 MED ORDER — FENTANYL CITRATE (PF) 100 MCG/2ML IJ SOLN
INTRAMUSCULAR | Status: AC
  Filled 2024-05-27: qty 2

## 2024-05-27 MED ORDER — HEPARIN SODIUM (PORCINE) 1000 UNIT/ML IJ SOLN
INTRAMUSCULAR | Status: AC
  Filled 2024-05-27: qty 10

## 2024-05-27 MED ORDER — MIDAZOLAM HCL 2 MG/2ML IJ SOLN
INTRAMUSCULAR | Status: AC
  Filled 2024-05-27: qty 2

## 2024-05-27 MED ORDER — ASPIRIN 81 MG PO CHEW: 81.0000 mg | CHEWABLE_TABLET | ORAL | Status: DC

## 2024-05-27 MED ORDER — MIDAZOLAM HCL (PF) 2 MG/2ML IJ SOLN
INTRAMUSCULAR | Status: DC | PRN
  Administered 2024-05-27: 2 mg via INTRAVENOUS

## 2024-05-27 MED ORDER — FENTANYL CITRATE (PF) 100 MCG/2ML IJ SOLN
INTRAMUSCULAR | Status: DC | PRN
  Administered 2024-05-27: 50 ug via INTRAVENOUS

## 2024-05-27 MED ORDER — SODIUM CHLORIDE 0.9 % IV SOLN: 250.0000 mL | INTRAVENOUS | Status: DC | PRN

## 2024-05-27 MED ORDER — HEPARIN (PORCINE) IN NACL 1000-0.9 UT/500ML-% IV SOLN
INTRAVENOUS | Status: DC | PRN
  Administered 2024-05-27: 1000 mL

## 2024-05-27 MED ORDER — VERAPAMIL HCL 2.5 MG/ML IV SOLN
INTRAVENOUS | Status: DC | PRN
  Administered 2024-05-27: 10 mL via INTRA_ARTERIAL

## 2024-05-27 MED ORDER — LIDOCAINE HCL (PF) 1 % IJ SOLN
INTRAMUSCULAR | Status: AC
  Filled 2024-05-27: qty 30

## 2024-05-27 MED ORDER — HEPARIN SODIUM (PORCINE) 1000 UNIT/ML IJ SOLN
INTRAMUSCULAR | Status: DC | PRN
  Administered 2024-05-27: 4500 [IU] via INTRAVENOUS

## 2024-05-27 MED ORDER — IOHEXOL 350 MG/ML SOLN
INTRAVENOUS | Status: DC | PRN
  Administered 2024-05-27: 80 mL

## 2024-05-27 MED ORDER — VERAPAMIL HCL 2.5 MG/ML IV SOLN
INTRAVENOUS | Status: AC
  Filled 2024-05-27: qty 2

## 2024-05-27 NOTE — Interval H&P Note (Signed)
 History and Physical Interval Note:  05/27/2024 8:45 AM  PCP: Xavier Leta NOVAK, MD  [facility] Providers Cardiologist:  Xavier Carrier, MD    History of Present Illness: .   Xavier Walsh is a 59 y.o. male with a PMH of hypertension, shortness of breath, and dizziness, who presents today for follow-up and for pre-op clearance.    Last seen by Dr. Carrier Xavier on April 12, 2023.  Patient noted some shortness of breath with inclines, denied any chest pain.  BP was above goal in office, lisinopril  was increased to 20 mg daily, and he also reported some fatigue and dizziness since changing from Bystolic , metoprolol  was lowered to 25 mg daily.  Echocardiogram was benign.   05/07/2023 - Today he presents for 4-week follow-up.  He states he notes intermittent dizziness noticed with position changes in the early morning. Provides his BP log from home, with average SBP of 130's  - 140's. Taking Toprol  XL in AM and lisinopril  is taken in the PM. Denies any chest pain, shortness of breath, palpitations, syncope, presyncope, orthopnea, PND, swelling or significant weight changes, acute bleeding, or claudication.   07/16/2023 - Presents today for follow-up. Brings in his BP cuff from home with half of his readings at goal. Does admit to some nervousness, BP tends to run higher in office than at home. Does admit to nerve pain in his feet noted at night, has been ongoing for the past few weeks. Describes this as burning/itching sensation. Does admit to possible acid reflex/epigastric pain and right upper quadrant pain, says he can feel his pulse. Denies any chest pain, shortness of breath, syncope, presyncope, dizziness, orthopnea, PND, swelling or significant weight changes, acute bleeding, or claudication.   01/17/2024 - Here for follow-up. Does admit to some shortness of breath at times, denies any recent chest pain. Denies any palpitations, syncope, presyncope, dizziness, orthopnea, PND, swelling  or significant weight changes, acute bleeding, or claudication. No longer having any itching recently after adjusting the timing of taking his medications. He presents his vitals from home that show overall well controlled BP and HR readings. Plans to potentially have knee operation in the future.    04/17/2024 - Our office received clearance for surgery in October 2025 for right knee surgery. Was recommended to proceed with stress testing prior to knee surgery. 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  There was ST depression along inferior lateral leads during stress portion and occurring while tachycardic, felt to be possibly rate related.  He is here for follow-up and for preop clearance.  He states he has sharp chest pains, also noticing dyspnea on exertion and has been ongoing over the past year, but more progressive recently. Used to run hills and be active and now not as much. Now noticing shortness of breath with exertion while completing tasks. Chest pain is not always exertional, says he could be sitting and noticing a sharp chest pain. Does admit to anxiety and hx of panic attacks, says he had a hard time completing the NST and laying still. Says he has a general sense of not feeling quite right. Denies any palpitations, syncope, presyncope, dizziness, orthopnea, PND, swelling or significant weight changes, acute bleeding, or claudication. Does have hx of second hand tobacco exposure.   Due to the abnormal stress test and need for clarification he was referred for left heart catheterization with coronary angiography.  Unfortunately  when he first presented he had extreme amount of anxiety and the procedure would have been delayed due to possible STEMI.  The patient was not able to tolerate getting on the Cath Lab table.  Patient was not able to proceed with the procedure and we therefore canceled the  procedure postponed until he can be brought in having been given premedication with Valium  overnight and upon arrival to the short stay area.  Xavier Walsh  has presented today for surgery, with the diagnosis of abnormal stress test.  The various methods of treatment have been discussed with the patient and family. After consideration of risks, benefits and other options for treatment, the patient has consented to  Procedures: LEFT HEART CATH AND CORONARY ANGIOGRAPHY (N/A)  PERCUTANEOUS CORONARY INTERVENTION  as a surgical intervention.  The patient's history has been reviewed, patient examined, no change in status, stable for surgery.  I have reviewed the patient's chart and labs.  Questions were answered to the patient's satisfaction.    Cath Lab Visit (complete for each Cath Lab visit)  Clinical Evaluation Leading to the Procedure:   ACS: No.  Non-ACS:    Anginal Classification: CCS II  Anti-ischemic medical therapy: Minimal Therapy (1 class of medications)  Non-Invasive Test Results: Low-risk stress test findings: cardiac mortality <1%/year  Prior CABG: No previous CABG     Xavier Walsh

## 2024-05-30 ENCOUNTER — Ambulatory Visit: Attending: Nurse Practitioner | Admitting: Nurse Practitioner

## 2024-05-30 ENCOUNTER — Encounter: Payer: Self-pay | Admitting: Nurse Practitioner

## 2024-05-30 VITALS — BP 140/86 | HR 66 | Ht 69.0 in | Wt 212.6 lb

## 2024-05-30 DIAGNOSIS — I714 Abdominal aortic aneurysm, without rupture, unspecified: Secondary | ICD-10-CM | POA: Diagnosis not present

## 2024-05-30 DIAGNOSIS — I10 Essential (primary) hypertension: Secondary | ICD-10-CM | POA: Diagnosis not present

## 2024-05-30 DIAGNOSIS — I712 Thoracic aortic aneurysm, without rupture, unspecified: Secondary | ICD-10-CM

## 2024-05-30 DIAGNOSIS — E669 Obesity, unspecified: Secondary | ICD-10-CM | POA: Diagnosis not present

## 2024-05-30 DIAGNOSIS — I77819 Aortic ectasia, unspecified site: Secondary | ICD-10-CM | POA: Diagnosis not present

## 2024-05-30 DIAGNOSIS — Z01818 Encounter for other preprocedural examination: Secondary | ICD-10-CM | POA: Diagnosis not present

## 2024-05-30 DIAGNOSIS — F419 Anxiety disorder, unspecified: Secondary | ICD-10-CM

## 2024-05-30 DIAGNOSIS — I2510 Atherosclerotic heart disease of native coronary artery without angina pectoris: Secondary | ICD-10-CM

## 2024-05-30 NOTE — Patient Instructions (Addendum)

## 2024-05-30 NOTE — Progress Notes (Addendum)
 " Cardiology Office Note:  .   Date:  05/30/2024 ID:  Xavier Walsh, DOB 04-20-1966, MRN [id number] PCP: Rosamond Leta NOVAK, MD  [facility] Providers Cardiologist:  Alvan Carrier, MD    History of Present Illness: .   Xavier Walsh is a 59 y.o. male with a PMH of hypertension, shortness of breath, and dizziness, who presents today for post cath follow-up and for pre-op clearance.   Last seen by Dr. Carrier Alvan on April 12, 2023.  Patient noted some shortness of breath with inclines, denied any chest pain.  BP was above goal in office, lisinopril  was increased to 20 mg daily, and he also reported some fatigue and dizziness since changing from Bystolic , metoprolol  was lowered to 25 mg daily.  Echocardiogram was benign.  05/07/2023 - Today he presents for 4-week follow-up.  He states he notes intermittent dizziness noticed with position changes in the early morning. Provides his BP log from home, with average SBP of 130's  - 140's. Taking Toprol  XL in AM and lisinopril  is taken in the PM. Denies any chest pain, shortness of breath, palpitations, syncope, presyncope, orthopnea, PND, swelling or significant weight changes, acute bleeding, or claudication.  07/16/2023 - Presents today for follow-up. Brings in his BP cuff from home with half of his readings at goal. Does admit to some nervousness, BP tends to run higher in office than at home. Does admit to nerve pain in his feet noted at night, has been ongoing for the past few weeks. Describes this as burning/itching sensation. Does admit to possible acid reflex/epigastric pain and right upper quadrant pain, says he can feel his pulse. Denies any chest pain, shortness of breath, syncope, presyncope, dizziness, orthopnea, PND, swelling or significant weight changes, acute bleeding, or claudication.  01/17/2024 - Here for follow-up. Does admit to some shortness of breath at times, denies any recent chest pain. Denies any palpitations, syncope,  presyncope, dizziness, orthopnea, PND, swelling or significant weight changes, acute bleeding, or claudication. No longer having any itching recently after adjusting the timing of taking his medications. He presents his vitals from home that show overall well controlled BP and HR readings. Plans to potentially have knee operation in the future.   04/17/2024 - Our office received clearance for surgery in October 2025 for right knee surgery. Was recommended to proceed with stress testing prior to knee surgery. 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  There was ST depression along inferior lateral leads during stress portion and occurring while tachycardic, felt to be possibly rate related.  He is here for follow-up and for preop clearance.  He states he has sharp chest pains, also noticing dyspnea on exertion and has been ongoing over the past year, but more progressive recently. Used to run hills and be active and now not as much. Now noticing shortness of breath with exertion while completing tasks. Chest pain is not always exertional, says he could be sitting and noticing a sharp chest pain. Does admit to anxiety and hx of panic attacks, says he had a hard time completing the NST and laying still. Says he has a general sense of not feeling quite right. Denies any palpitations, syncope, presyncope, dizziness, orthopnea, PND, swelling or significant weight changes, acute bleeding, or claudication. Does have hx of second hand tobacco exposure.   He underwent LHC on 05/27/2024 that showed angiographically minimal CAD.   He is here  for post cath follow-up. He states he is doing well since his heart cath. Denies any chest pain, shortness of breath, palpitations, syncope, presyncope, dizziness, orthopnea, PND, swelling or significant weight changes, acute bleeding, or claudication. Does admit to what sounds like  possible diabetic neuropathy, otherwise doing well. Presents his BP log from home that shows SBP in 120's  - 130's.    ROS: Negative. See HPI.  FH: positive for T2DM, CVA, pulmonary fibrosis, and lung cancer. Wife is a [employer].  Studies Reviewed: SABRA    EKG: EKG is not ordered today.   LHC 05/2024:    Ost LAD to Prox LAD lesion is 15% stenosed.   There is hyperdynamic left ventricular systolic function. The left ventricular ejection fraction is greater than 65% by visual estimate. LV end diastolic pressure is mildly elevated -16 mmHg   There is no aortic valve stenosis.   Dominance: Right  Angiographically minimal CAD. Hyperdynamic LV with EF> 65%.      RECOMMENDATIONS   In the absence of any other complications or medical issues, we expect the patient to be ready for discharge from a cath perspective on 05/27/2024.   No indication for antiplatelet therapy at this time ..   Lexiscan  02/2024:    Findings are consistent with prior inferior infarction with mild peri-infarct ischemia. The study is low risk.   ST depression along inferior and lateral leads during stress portion, occuring while tachycardiac and possibly rate-related.   LV perfusion is abnormal. Moderate size mild intensity inferior wall defect with mild reversibility.   Nuclear stress EF: 64%. The left ventricular ejection fraction is normal (55-65%). End diastolic cavity size is normal.  Echo 03/2023:  1. Left ventricular ejection fraction, by estimation, is 70 to 75%. The  left ventricle has hyperdynamic function. The left ventricle has no  regional wall motion abnormalities. There is mild left ventricular  hypertrophy. Left ventricular diastolic  parameters were normal.   2. Right ventricular systolic function is normal. The right ventricular  size is normal.   3. The mitral valve is normal in structure. No evidence of mitral valve  regurgitation. No evidence of mitral stenosis.   4. The tricuspid valve is abnormal.   5.  The aortic valve has an indeterminant number of cusps. Aortic valve  regurgitation is mild. No aortic stenosis is present.   6. Aortic dilatation noted. There is mild dilatation of the aortic root,  measuring 40 mm. There is mild dilatation of the ascending aorta,  measuring 40 mm.   7. IVC is small suggesting low RA pressure and hypovolemia.   Comparison(s): No prior study.     Physical Exam:   VS:  BP (!) 140/86 (BP Location: Left Arm, Patient Position: Sitting, Cuff Size: Normal)   Pulse 66   Ht 5' 9 (1.753 m)   Wt 212 lb 9.6 oz (96.4 kg)   SpO2 98%   BMI 31.40 kg/m    Wt Readings from Last 3 Encounters:  05/30/24 212 lb 9.6 oz (96.4 kg)  05/27/24 210 lb (95.3 kg)  04/30/24 210 lb (95.3 kg)     GEN: Well nourished, well developed in no acute distress NECK: No JVD; No carotid bruits CARDIAC: S1/S2, RRR, no murmurs, rubs, gallops RESPIRATORY:  Clear to auscultation without rales, wheezing or rhonchi  ABDOMEN: Soft, non-tender, non-distended EXTREMITIES:  No edema; No deformity   ASSESSMENT AND PLAN: .    CAD Most recent LHC showed angiographically minimal CAD. Stable with no anginal symptoms.  No indication for ischemic evaluation. There was no indication for antiplatelet therapy at that time. Continue current medication regimen. Heart healthy diet and regular cardiovascular exercise encouraged. Plan to repeat FLP by next office visit, if LDL has not improved by that point, will discuss medication.   HTN SBP at home averaging 120s - 130's. SBP goal is less than 130. Believe anxiety is contributing to this. Did offer/recommend to adjust medication. Pt declines, and favors lifestyle changes. Continue current medication regimen. Discussed to monitor BP at home at least 2 hours after medications and sitting for 5-10 minutes.  Heart healthy diet and regular cardiovascular exercise encouraged.   3. Obesity Weight loss via diet and exercise encouraged. Discussed the impact being  overweight would have on cardiovascular risk.  4.  Aortic dilatation, AAA Echo from November 2024 showed mild dilatation of aortic root, measuring 40 mm along with mild dilatation of ascending aorta, measuring 40 mm. Does have a known past AAA. Has seen [facility] for this last year. Was recommended to repeat AAA duplex in 3 years, would plan to repeat AAA 08/2026. Will update Echo at this time to evaluate his ascending aorta.   5. Anxiety Recommended to follow-up with PCP for evaluation and management.   6. Pre-op clearance Mr. Mcginnis's perioperative risk of a major cardiac event is 0.4% according to the Revised Cardiac Risk Index (RCRI).  Therefore, he is at low risk for perioperative complications.   His functional capacity is excellent at 8.23 METs according to the Duke Activity Status Index (DASI). Recommendations: The patient requires an echocardiogram before a disposition can be made regarding surgical risk.                Antiplatelet and/or Anticoagulation Recommendations: He is not on any antiplatelet or anticoagulation medication that needs to be held prior to procedure.    Dispo: Care and ED precautions discussed.  Follow-up with Dr. Dorn Ross or APP in 6 months or sooner if anything changes.  Signed, Almarie Crate, NP   "

## 2024-06-02 ENCOUNTER — Other Ambulatory Visit (HOSPITAL_COMMUNITY): Payer: Self-pay

## 2024-06-02 ENCOUNTER — Other Ambulatory Visit: Payer: Self-pay

## 2024-06-03 ENCOUNTER — Other Ambulatory Visit: Payer: Self-pay

## 2024-06-04 ENCOUNTER — Other Ambulatory Visit: Payer: Self-pay

## 2024-06-04 ENCOUNTER — Other Ambulatory Visit (HOSPITAL_COMMUNITY): Payer: Self-pay

## 2024-06-18 ENCOUNTER — Other Ambulatory Visit: Payer: Self-pay

## 2024-06-18 ENCOUNTER — Other Ambulatory Visit (HOSPITAL_COMMUNITY): Payer: Self-pay

## 2024-06-19 ENCOUNTER — Other Ambulatory Visit (HOSPITAL_COMMUNITY): Payer: Self-pay

## 2024-06-30 ENCOUNTER — Ambulatory Visit
